# Patient Record
Sex: Male | Born: 1965 | Hispanic: Yes | Marital: Married | Smoking: Never smoker
Health system: Southern US, Community
[De-identification: ages and names within clinical notes are randomized; demographics above are authoritative.]

## PROBLEM LIST (undated history)

## (undated) DIAGNOSIS — E119 Type 2 diabetes mellitus without complications: Secondary | ICD-10-CM

## (undated) HISTORY — PX: APPENDECTOMY: SHX54

---

## 2018-11-06 ENCOUNTER — Encounter: Payer: Self-pay | Admitting: Emergency Medicine

## 2018-11-06 ENCOUNTER — Other Ambulatory Visit: Payer: Self-pay

## 2018-11-06 DIAGNOSIS — Z6831 Body mass index (BMI) 31.0-31.9, adult: Secondary | ICD-10-CM

## 2018-11-06 DIAGNOSIS — E669 Obesity, unspecified: Secondary | ICD-10-CM | POA: Diagnosis present

## 2018-11-06 DIAGNOSIS — Z888 Allergy status to other drugs, medicaments and biological substances status: Secondary | ICD-10-CM

## 2018-11-06 DIAGNOSIS — Z9119 Patient's noncompliance with other medical treatment and regimen: Secondary | ICD-10-CM

## 2018-11-06 DIAGNOSIS — K82A1 Gangrene of gallbladder in cholecystitis: Principal | ICD-10-CM | POA: Diagnosis present

## 2018-11-06 DIAGNOSIS — K8000 Calculus of gallbladder with acute cholecystitis without obstruction: Secondary | ICD-10-CM | POA: Diagnosis present

## 2018-11-06 DIAGNOSIS — Z9049 Acquired absence of other specified parts of digestive tract: Secondary | ICD-10-CM

## 2018-11-06 DIAGNOSIS — Z20828 Contact with and (suspected) exposure to other viral communicable diseases: Secondary | ICD-10-CM | POA: Diagnosis present

## 2018-11-06 DIAGNOSIS — E1165 Type 2 diabetes mellitus with hyperglycemia: Secondary | ICD-10-CM | POA: Diagnosis present

## 2018-11-06 DIAGNOSIS — K828 Other specified diseases of gallbladder: Secondary | ICD-10-CM | POA: Diagnosis present

## 2018-11-06 DIAGNOSIS — Z23 Encounter for immunization: Secondary | ICD-10-CM

## 2018-11-06 LAB — COMPREHENSIVE METABOLIC PANEL
ALT: 164 U/L — ABNORMAL HIGH (ref 0–44)
AST: 128 U/L — ABNORMAL HIGH (ref 15–41)
Albumin: 4.1 g/dL (ref 3.5–5.0)
Alkaline Phosphatase: 150 U/L — ABNORMAL HIGH (ref 38–126)
Anion gap: 15 (ref 5–15)
BUN: 10 mg/dL (ref 6–20)
CO2: 26 mmol/L (ref 22–32)
Calcium: 9.4 mg/dL (ref 8.9–10.3)
Chloride: 91 mmol/L — ABNORMAL LOW (ref 98–111)
Creatinine, Ser: 0.61 mg/dL (ref 0.61–1.24)
GFR calc Af Amer: >60 mL/min (ref >60)
GFR calc non Af Amer: >60 mL/min (ref >60)
Glucose, Bld: 344 mg/dL — ABNORMAL HIGH (ref 70–99)
Potassium: 3.6 mmol/L (ref 3.5–5.1)
Sodium: 132 mmol/L — ABNORMAL LOW (ref 135–145)
Total Bilirubin: 1.4 mg/dL — ABNORMAL HIGH (ref 0.3–1.2)
Total Protein: 7.7 g/dL (ref 6.5–8.1)

## 2018-11-06 LAB — CBC WITH DIFFERENTIAL/PLATELET
Abs Immature Granulocytes: 0.1 10*3/uL — ABNORMAL HIGH (ref 0.00–0.07)
Basophils Absolute: 0.1 10*3/uL (ref 0.0–0.1)
Basophils Relative: 0 %
Eosinophils Absolute: 0 10*3/uL (ref 0.0–0.5)
Eosinophils Relative: 0 %
HCT: 45.7 % (ref 39.0–52.0)
Hemoglobin: 16.1 g/dL (ref 13.0–17.0)
Immature Granulocytes: 1 %
Lymphocytes Relative: 3 %
Lymphs Abs: 0.7 10*3/uL (ref 0.7–4.0)
MCH: 30.4 pg (ref 26.0–34.0)
MCHC: 35.2 g/dL (ref 30.0–36.0)
MCV: 86.2 fL (ref 80.0–100.0)
Monocytes Absolute: 0.9 10*3/uL (ref 0.1–1.0)
Monocytes Relative: 4 %
Neutro Abs: 19 10*3/uL — ABNORMAL HIGH (ref 1.7–7.7)
Neutrophils Relative %: 92 %
Platelets: 258 10*3/uL (ref 150–400)
RBC: 5.3 MIL/uL (ref 4.22–5.81)
RDW: 12.1 % (ref 11.5–15.5)
WBC: 20.7 10*3/uL — ABNORMAL HIGH (ref 4.0–10.5)
nRBC: 0 % (ref 0.0–0.2)

## 2018-11-06 LAB — LIPASE, BLOOD: Lipase: 24 U/L (ref 11–51)

## 2018-11-06 LAB — TROPONIN I (HIGH SENSITIVITY): Troponin I (High Sensitivity): 5 ng/L (ref <18)

## 2018-11-06 MED ORDER — LIDOCAINE VISCOUS HCL 2 % MT SOLN
15.0000 mL | Freq: Once | OROMUCOSAL | Status: AC
  Administered 2018-11-06: 15 mL via ORAL
  Filled 2018-11-06: qty 15

## 2018-11-06 MED ORDER — FENTANYL CITRATE (PF) 100 MCG/2ML IJ SOLN
50.0000 ug | INTRAMUSCULAR | Status: DC | PRN
  Administered 2018-11-06: 25 ug via NASAL
  Filled 2018-11-06: qty 2

## 2018-11-06 MED ORDER — ALUM & MAG HYDROXIDE-SIMETH 200-200-20 MG/5ML PO SUSP
30.0000 mL | Freq: Once | ORAL | Status: AC
  Administered 2018-11-06: 30 mL via ORAL
  Filled 2018-11-06: qty 30

## 2018-11-06 MED ORDER — ONDANSETRON 4 MG PO TBDP
4.0000 mg | ORAL_TABLET | Freq: Once | ORAL | Status: AC | PRN
  Administered 2018-11-06: 4 mg via ORAL
  Filled 2018-11-06: qty 1

## 2018-11-06 NOTE — ED Triage Notes (Addendum)
Pt to triage via w/c with no distress noted, mask in place, brought in by EMS; reports upper abd pain and "burning"; radiating into back; rx omeprazole today without relief; denies hx of same; st pain began after eating a lot of hot sauce yesterday

## 2018-11-07 ENCOUNTER — Other Ambulatory Visit: Payer: Self-pay

## 2018-11-07 ENCOUNTER — Inpatient Hospital Stay: Payer: Self-pay | Admitting: Anesthesiology

## 2018-11-07 ENCOUNTER — Inpatient Hospital Stay
Admission: EM | Admit: 2018-11-07 | Discharge: 2018-11-10 | DRG: 418 | Disposition: A | Discharge status: Home / Self Care | Payer: Self-pay | Attending: Surgery | Admitting: Surgery

## 2018-11-07 ENCOUNTER — Inpatient Hospital Stay: Payer: Self-pay

## 2018-11-07 ENCOUNTER — Encounter
Admission: EM | Disposition: A | Discharge status: Home / Self Care | Payer: Self-pay | Source: Home / Self Care | Attending: Surgery

## 2018-11-07 ENCOUNTER — Emergency Department: Payer: Self-pay

## 2018-11-07 ENCOUNTER — Encounter: Payer: Self-pay | Admitting: Anesthesiology

## 2018-11-07 DIAGNOSIS — K819 Cholecystitis, unspecified: Secondary | ICD-10-CM | POA: Diagnosis present

## 2018-11-07 DIAGNOSIS — Z419 Encounter for procedure for purposes other than remedying health state, unspecified: Secondary | ICD-10-CM

## 2018-11-07 DIAGNOSIS — R109 Unspecified abdominal pain: Secondary | ICD-10-CM

## 2018-11-07 DIAGNOSIS — K810 Acute cholecystitis: Secondary | ICD-10-CM

## 2018-11-07 HISTORY — DX: Type 2 diabetes mellitus without complications: E11.9

## 2018-11-07 HISTORY — PX: CHOLECYSTECTOMY: SHX55

## 2018-11-07 LAB — COMPREHENSIVE METABOLIC PANEL
ALT: 124 U/L — ABNORMAL HIGH (ref 0–44)
AST: 71 U/L — ABNORMAL HIGH (ref 15–41)
Albumin: 3.7 g/dL (ref 3.5–5.0)
Alkaline Phosphatase: 124 U/L (ref 38–126)
Anion gap: 11 (ref 5–15)
BUN: 13 mg/dL (ref 6–20)
CO2: 26 mmol/L (ref 22–32)
Calcium: 8.7 mg/dL — ABNORMAL LOW (ref 8.9–10.3)
Chloride: 96 mmol/L — ABNORMAL LOW (ref 98–111)
Creatinine, Ser: 0.81 mg/dL (ref 0.61–1.24)
GFR calc Af Amer: >60 mL/min (ref >60)
GFR calc non Af Amer: >60 mL/min (ref >60)
Glucose, Bld: 366 mg/dL — ABNORMAL HIGH (ref 70–99)
Potassium: 4.5 mmol/L (ref 3.5–5.1)
Sodium: 133 mmol/L — ABNORMAL LOW (ref 135–145)
Total Bilirubin: 1.4 mg/dL — ABNORMAL HIGH (ref 0.3–1.2)
Total Protein: 6.9 g/dL (ref 6.5–8.1)

## 2018-11-07 LAB — CBC
HCT: 42.6 % (ref 39.0–52.0)
Hemoglobin: 15 g/dL (ref 13.0–17.0)
MCH: 30.5 pg (ref 26.0–34.0)
MCHC: 35.2 g/dL (ref 30.0–36.0)
MCV: 86.8 fL (ref 80.0–100.0)
Platelets: 209 10*3/uL (ref 150–400)
RBC: 4.91 MIL/uL (ref 4.22–5.81)
RDW: 12.6 % (ref 11.5–15.5)
WBC: 29.6 10*3/uL — ABNORMAL HIGH (ref 4.0–10.5)
nRBC: 0 % (ref 0.0–0.2)

## 2018-11-07 LAB — GLUCOSE, CAPILLARY
Glucose-Capillary: 366 mg/dL — ABNORMAL HIGH (ref 70–99)
Glucose-Capillary: 321 mg/dL — ABNORMAL HIGH (ref 70–99)
Glucose-Capillary: 328 mg/dL — ABNORMAL HIGH (ref 70–99)
Glucose-Capillary: 378 mg/dL — ABNORMAL HIGH (ref 70–99)
Glucose-Capillary: 315 mg/dL — ABNORMAL HIGH (ref 70–99)
Glucose-Capillary: 326 mg/dL — ABNORMAL HIGH (ref 70–99)

## 2018-11-07 LAB — APTT: aPTT: 28 seconds (ref 24–36)

## 2018-11-07 LAB — SARS CORONAVIRUS 2 BY RT PCR (HOSPITAL ORDER, PERFORMED IN ~~LOC~~ HOSPITAL LAB): SARS Coronavirus 2: NEGATIVE

## 2018-11-07 LAB — PROTIME-INR
INR: 1.1 (ref 0.8–1.2)
Prothrombin Time: 14.5 seconds (ref 11.4–15.2)

## 2018-11-07 LAB — BILIRUBIN, DIRECT: Bilirubin, Direct: 0.4 mg/dL — ABNORMAL HIGH (ref 0.0–0.2)

## 2018-11-07 SURGERY — LAPAROSCOPIC CHOLECYSTECTOMY WITH INTRAOPERATIVE CHOLANGIOGRAM
Anesthesia: General | Site: Abdomen

## 2018-11-07 MED ORDER — PROCHLORPERAZINE MALEATE 10 MG PO TABS
10.0000 mg | ORAL_TABLET | Freq: Four times a day (QID) | ORAL | Status: DC | PRN
  Filled 2018-11-07: qty 1

## 2018-11-07 MED ORDER — SODIUM CHLORIDE (PF) 0.9 % IJ SOLN
INTRAMUSCULAR | Status: AC
  Filled 2018-11-07: qty 50

## 2018-11-07 MED ORDER — BUPIVACAINE-EPINEPHRINE (PF) 0.25% -1:200000 IJ SOLN
INTRAMUSCULAR | Status: AC
  Filled 2018-11-07: qty 30

## 2018-11-07 MED ORDER — PROPOFOL 10 MG/ML IV BOLUS
INTRAVENOUS | Status: AC
  Filled 2018-11-07: qty 20

## 2018-11-07 MED ORDER — PROCHLORPERAZINE EDISYLATE 10 MG/2ML IJ SOLN
5.0000 mg | Freq: Four times a day (QID) | INTRAMUSCULAR | Status: DC | PRN
  Filled 2018-11-07: qty 2

## 2018-11-07 MED ORDER — SODIUM CHLORIDE 0.9 % IV BOLUS
1000.0000 mL | Freq: Once | INTRAVENOUS | Status: AC
  Administered 2018-11-07: 04:00:00 1000 mL via INTRAVENOUS

## 2018-11-07 MED ORDER — INSULIN ASPART 100 UNIT/ML ~~LOC~~ SOLN
5.0000 [IU] | Freq: Once | SUBCUTANEOUS | Status: AC
  Administered 2018-11-07: 5 [IU] via SUBCUTANEOUS

## 2018-11-07 MED ORDER — MEPERIDINE HCL 50 MG/ML IJ SOLN: 6.2500 mg | INTRAMUSCULAR | Status: DC | PRN

## 2018-11-07 MED ORDER — OXYCODONE HCL 5 MG PO TABS: 5.0000 mg | ORAL_TABLET | Freq: Once | ORAL | Status: DC | PRN

## 2018-11-07 MED ORDER — HEPARIN SODIUM (PORCINE) 5000 UNIT/ML IJ SOLN
5000.0000 [IU] | Freq: Three times a day (TID) | INTRAMUSCULAR | Status: DC
  Administered 2018-11-07 – 2018-11-10 (×9): 5000 [IU] via SUBCUTANEOUS
  Filled 2018-11-07 (×8): qty 1

## 2018-11-07 MED ORDER — KETOROLAC TROMETHAMINE 30 MG/ML IJ SOLN: 30.0000 mg | Freq: Four times a day (QID) | INTRAMUSCULAR | Status: DC | PRN

## 2018-11-07 MED ORDER — FENTANYL CITRATE (PF) 100 MCG/2ML IJ SOLN
INTRAMUSCULAR | Status: DC | PRN
  Administered 2018-11-07 (×2): 50 ug via INTRAVENOUS

## 2018-11-07 MED ORDER — ONDANSETRON HCL 4 MG/2ML IJ SOLN
4.0000 mg | Freq: Four times a day (QID) | INTRAMUSCULAR | Status: DC | PRN
  Administered 2018-11-07: 4 mg via INTRAVENOUS

## 2018-11-07 MED ORDER — ACETAMINOPHEN 500 MG PO TABS
1000.0000 mg | ORAL_TABLET | Freq: Four times a day (QID) | ORAL | Status: DC
  Administered 2018-11-07 – 2018-11-10 (×12): 1000 mg via ORAL
  Filled 2018-11-07 (×12): qty 2

## 2018-11-07 MED ORDER — PHENYLEPHRINE HCL (PRESSORS) 10 MG/ML IV SOLN
INTRAVENOUS | Status: DC | PRN
  Administered 2018-11-07: 200 ug via INTRAVENOUS
  Administered 2018-11-07 (×3): 100 ug via INTRAVENOUS

## 2018-11-07 MED ORDER — PANTOPRAZOLE SODIUM 40 MG IV SOLR
40.0000 mg | Freq: Every day | INTRAVENOUS | Status: DC
  Administered 2018-11-07 – 2018-11-09 (×3): 40 mg via INTRAVENOUS
  Filled 2018-11-07 (×3): qty 40

## 2018-11-07 MED ORDER — INSULIN ASPART 100 UNIT/ML ~~LOC~~ SOLN
6.0000 [IU] | Freq: Three times a day (TID) | SUBCUTANEOUS | Status: DC
  Administered 2018-11-07 – 2018-11-10 (×7): 6 [IU] via SUBCUTANEOUS
  Filled 2018-11-07 (×7): qty 1

## 2018-11-07 MED ORDER — MORPHINE SULFATE (PF) 4 MG/ML IV SOLN
4.0000 mg | Freq: Once | INTRAVENOUS | Status: AC
  Administered 2018-11-07: 4 mg via INTRAVENOUS
  Filled 2018-11-07: qty 1

## 2018-11-07 MED ORDER — ONDANSETRON 4 MG PO TBDP: 4.0000 mg | ORAL_TABLET | Freq: Four times a day (QID) | ORAL | Status: DC | PRN

## 2018-11-07 MED ORDER — MORPHINE SULFATE (PF) 4 MG/ML IV SOLN
4.0000 mg | Freq: Once | INTRAVENOUS | Status: AC
  Administered 2018-11-07: 02:00:00 4 mg via INTRAVENOUS
  Filled 2018-11-07: qty 1

## 2018-11-07 MED ORDER — ONDANSETRON HCL 4 MG/2ML IJ SOLN
INTRAMUSCULAR | Status: AC
  Filled 2018-11-07: qty 2

## 2018-11-07 MED ORDER — SODIUM CHLORIDE 0.9 % IV SOLN
INTRAVENOUS | Status: DC
  Administered 2018-11-07 – 2018-11-09 (×8): via INTRAVENOUS

## 2018-11-07 MED ORDER — KETOROLAC TROMETHAMINE 30 MG/ML IJ SOLN
30.0000 mg | Freq: Four times a day (QID) | INTRAMUSCULAR | Status: DC
  Administered 2018-11-07 – 2018-11-10 (×11): 30 mg via INTRAVENOUS
  Filled 2018-11-07 (×13): qty 1

## 2018-11-07 MED ORDER — PIPERACILLIN-TAZOBACTAM 3.375 G IVPB 30 MIN
3.3750 g | Freq: Once | INTRAVENOUS | Status: AC
  Administered 2018-11-07: 04:00:00 3.375 g via INTRAVENOUS
  Filled 2018-11-07: qty 50

## 2018-11-07 MED ORDER — GLUCAGON HCL RDNA (DIAGNOSTIC) 1 MG IJ SOLR
INTRAMUSCULAR | Status: AC
  Filled 2018-11-07: qty 1

## 2018-11-07 MED ORDER — PNEUMOCOCCAL VAC POLYVALENT 25 MCG/0.5ML IJ INJ
0.5000 mL | INJECTION | INTRAMUSCULAR | Status: AC
  Administered 2018-11-08: 09:00:00 0.5 mL via INTRAMUSCULAR
  Filled 2018-11-07: qty 0.5

## 2018-11-07 MED ORDER — SUGAMMADEX SODIUM 500 MG/5ML IV SOLN
INTRAVENOUS | Status: DC | PRN
  Administered 2018-11-07: 300 mg via INTRAVENOUS

## 2018-11-07 MED ORDER — DEXAMETHASONE SODIUM PHOSPHATE 10 MG/ML IJ SOLN
INTRAMUSCULAR | Status: DC | PRN
  Administered 2018-11-07: 5 mg via INTRAVENOUS

## 2018-11-07 MED ORDER — INSULIN ASPART 100 UNIT/ML ~~LOC~~ SOLN
0.0000 [IU] | Freq: Three times a day (TID) | SUBCUTANEOUS | Status: DC
  Administered 2018-11-07: 18:00:00 15 [IU] via SUBCUTANEOUS
  Administered 2018-11-07: 09:00:00 20 [IU] via SUBCUTANEOUS
  Administered 2018-11-08 (×2): 7 [IU] via SUBCUTANEOUS
  Administered 2018-11-08: 4 [IU] via SUBCUTANEOUS
  Administered 2018-11-09: 6 [IU] via SUBCUTANEOUS
  Administered 2018-11-09 – 2018-11-10 (×2): 4 [IU] via SUBCUTANEOUS
  Administered 2018-11-10: 3 [IU] via SUBCUTANEOUS
  Filled 2018-11-07 (×9): qty 1

## 2018-11-07 MED ORDER — BUPIVACAINE-EPINEPHRINE 0.25% -1:200000 IJ SOLN
INTRAMUSCULAR | Status: DC | PRN
  Administered 2018-11-07: 30 mL

## 2018-11-07 MED ORDER — FENTANYL CITRATE (PF) 250 MCG/5ML IJ SOLN
INTRAMUSCULAR | Status: AC
  Filled 2018-11-07: qty 5

## 2018-11-07 MED ORDER — HYDRALAZINE HCL 20 MG/ML IJ SOLN: 10.0000 mg | INTRAMUSCULAR | Status: DC | PRN

## 2018-11-07 MED ORDER — MIDAZOLAM HCL 2 MG/2ML IJ SOLN
INTRAMUSCULAR | Status: DC | PRN
  Administered 2018-11-07: 2 mg via INTRAVENOUS

## 2018-11-07 MED ORDER — SODIUM CHLORIDE 0.9 % IV BOLUS: 1000.0000 mL | Freq: Once | INTRAVENOUS | Status: DC

## 2018-11-07 MED ORDER — INSULIN ASPART 100 UNIT/ML ~~LOC~~ SOLN
SUBCUTANEOUS | Status: AC
  Administered 2018-11-07: 14:00:00 5 [IU]
  Filled 2018-11-07: qty 1

## 2018-11-07 MED ORDER — MORPHINE SULFATE (PF) 2 MG/ML IV SOLN: 2.0000 mg | INTRAVENOUS | Status: DC | PRN

## 2018-11-07 MED ORDER — DIPHENHYDRAMINE HCL 25 MG PO CAPS: 25.0000 mg | ORAL_CAPSULE | Freq: Four times a day (QID) | ORAL | Status: DC | PRN

## 2018-11-07 MED ORDER — LIDOCAINE HCL (CARDIAC) PF 100 MG/5ML IV SOSY
PREFILLED_SYRINGE | INTRAVENOUS | Status: DC | PRN
  Administered 2018-11-07: 100 mg via INTRAVENOUS

## 2018-11-07 MED ORDER — LACTATED RINGERS IV SOLN
INTRAVENOUS | Status: DC | PRN
  Administered 2018-11-07: 11:00:00 via INTRAVENOUS

## 2018-11-07 MED ORDER — IOHEXOL 300 MG/ML  SOLN
INTRAMUSCULAR | Status: DC | PRN
  Administered 2018-11-07: 15 mL

## 2018-11-07 MED ORDER — GLUCAGON HCL RDNA (DIAGNOSTIC) 1 MG IJ SOLR
INTRAMUSCULAR | Status: DC | PRN
  Administered 2018-11-07: 1 mL via INTRAVENOUS

## 2018-11-07 MED ORDER — ONDANSETRON HCL 4 MG/2ML IJ SOLN: 4.0000 mg | INTRAMUSCULAR | Status: DC

## 2018-11-07 MED ORDER — DIPHENHYDRAMINE HCL 50 MG/ML IJ SOLN: 25.0000 mg | Freq: Four times a day (QID) | INTRAMUSCULAR | Status: DC | PRN

## 2018-11-07 MED ORDER — INSULIN DETEMIR 100 UNIT/ML ~~LOC~~ SOLN
5.0000 [IU] | Freq: Every day | SUBCUTANEOUS | Status: DC
  Administered 2018-11-07: 5 [IU] via SUBCUTANEOUS
  Filled 2018-11-07 (×2): qty 0.05

## 2018-11-07 MED ORDER — INSULIN ASPART 100 UNIT/ML ~~LOC~~ SOLN
0.0000 [IU] | Freq: Every day | SUBCUTANEOUS | Status: DC
  Administered 2018-11-07: 22:00:00 4 [IU] via SUBCUTANEOUS
  Administered 2018-11-08: 2 [IU] via SUBCUTANEOUS
  Administered 2018-11-09: 22:00:00 3 [IU] via SUBCUTANEOUS
  Filled 2018-11-07 (×3): qty 1

## 2018-11-07 MED ORDER — MIDAZOLAM HCL 2 MG/2ML IJ SOLN
INTRAMUSCULAR | Status: AC
  Filled 2018-11-07: qty 2

## 2018-11-07 MED ORDER — OXYCODONE HCL 5 MG PO TABS: 5.0000 mg | ORAL_TABLET | ORAL | Status: DC | PRN

## 2018-11-07 MED ORDER — OXYCODONE HCL 5 MG/5ML PO SOLN: 5.0000 mg | Freq: Once | ORAL | Status: DC | PRN

## 2018-11-07 MED ORDER — PIPERACILLIN-TAZOBACTAM 3.375 G IVPB
3.3750 g | Freq: Three times a day (TID) | INTRAVENOUS | Status: DC
  Administered 2018-11-07 – 2018-11-09 (×7): 3.375 g via INTRAVENOUS
  Filled 2018-11-07 (×8): qty 50

## 2018-11-07 MED ORDER — PROPOFOL 10 MG/ML IV BOLUS
INTRAVENOUS | Status: DC | PRN
  Administered 2018-11-07: 160 mg via INTRAVENOUS

## 2018-11-07 MED ORDER — ROCURONIUM BROMIDE 100 MG/10ML IV SOLN
INTRAVENOUS | Status: DC | PRN
  Administered 2018-11-07: 50 mg via INTRAVENOUS
  Administered 2018-11-07: 20 mg via INTRAVENOUS

## 2018-11-07 MED ORDER — FENTANYL CITRATE (PF) 100 MCG/2ML IJ SOLN: 25.0000 ug | INTRAMUSCULAR | Status: DC | PRN

## 2018-11-07 MED ORDER — PROMETHAZINE HCL 25 MG/ML IJ SOLN: 6.2500 mg | INTRAMUSCULAR | Status: DC | PRN

## 2018-11-07 SURGICAL SUPPLY — 49 items
APPLICATOR COTTON TIP 6 STRL (MISCELLANEOUS) ×1 IMPLANT
APPLICATOR COTTON TIP 6IN STRL (MISCELLANEOUS) ×3
APPLIER CLIP 5 13 M/L LIGAMAX5 (MISCELLANEOUS) ×3
BLADE CLIPPER SURG (BLADE) ×3 IMPLANT
BULB RESERV EVAC DRAIN JP 100C (MISCELLANEOUS) ×3 IMPLANT
CANISTER SUCT 1200ML W/VALVE (MISCELLANEOUS) ×3 IMPLANT
CHLORAPREP W/TINT 26 (MISCELLANEOUS) ×6 IMPLANT
CHOLANGIOGRAM CATH TAUT (CATHETERS) ×3 IMPLANT
CLIP APPLIE 5 13 M/L LIGAMAX5 (MISCELLANEOUS) ×1 IMPLANT
COVER WAND RF STERILE (DRAPES) ×3 IMPLANT
DECANTER SPIKE VIAL GLASS SM (MISCELLANEOUS) ×6 IMPLANT
DERMABOND ADVANCED (GAUZE/BANDAGES/DRESSINGS) ×2
DERMABOND ADVANCED .7 DNX12 (GAUZE/BANDAGES/DRESSINGS) ×1 IMPLANT
DRAIN CHANNEL JP 19F (MISCELLANEOUS) ×3 IMPLANT
DRAPE C-ARM XRAY 36X54 (DRAPES) ×6 IMPLANT
ELECT CAUTERY BLADE 6.4 (BLADE) ×3 IMPLANT
ELECT REM PT RETURN 9FT ADLT (ELECTROSURGICAL) ×3
ELECTRODE REM PT RTRN 9FT ADLT (ELECTROSURGICAL) ×1 IMPLANT
ENDOLOOP SUT PDS II  0 18 (SUTURE) ×2
ENDOLOOP SUT PDS II 0 18 (SUTURE) ×1 IMPLANT
GLOVE BIO SURGEON STRL SZ7 (GLOVE) ×21 IMPLANT
GOWN STRL REUS W/ TWL LRG LVL3 (GOWN DISPOSABLE) ×4 IMPLANT
GOWN STRL REUS W/TWL LRG LVL3 (GOWN DISPOSABLE) ×8
IRRIGATION STRYKERFLOW (MISCELLANEOUS) ×1 IMPLANT
IRRIGATOR STRYKERFLOW (MISCELLANEOUS) ×3
IV CATH ANGIO 12GX3 LT BLUE (NEEDLE) ×3 IMPLANT
IV NS 1000ML (IV SOLUTION) ×2
IV NS 1000ML BAXH (IV SOLUTION) ×1 IMPLANT
L-HOOK LAP DISP 36CM (ELECTROSURGICAL) ×3
LHOOK LAP DISP 36CM (ELECTROSURGICAL) ×1 IMPLANT
NEEDLE HYPO 22GX1.5 SAFETY (NEEDLE) ×3 IMPLANT
NS IRRIG 500ML POUR BTL (IV SOLUTION) ×3 IMPLANT
PACK LAP CHOLECYSTECTOMY (MISCELLANEOUS) ×3 IMPLANT
PENCIL ELECTRO HAND CTR (MISCELLANEOUS) ×3 IMPLANT
POUCH SPECIMEN RETRIEVAL 10MM (ENDOMECHANICALS) ×3 IMPLANT
SCISSORS METZENBAUM CVD 33 (INSTRUMENTS) ×3 IMPLANT
SET TUBE SMOKE EVAC HIGH FLOW (TUBING) ×3 IMPLANT
SLEEVE ENDOPATH XCEL 5M (ENDOMECHANICALS) ×9 IMPLANT
SPONGE DRAIN TRACH 4X4 STRL 2S (GAUZE/BANDAGES/DRESSINGS) ×3 IMPLANT
SPONGE LAP 18X18 RF (DISPOSABLE) ×3 IMPLANT
STOPCOCK 4 WAY LG BORE MALE ST (IV SETS) ×3 IMPLANT
SUT ETHIBOND 0 MO6 C/R (SUTURE) IMPLANT
SUT ETHILON 3-0 FS-10 30 BLK (SUTURE) ×3
SUT MNCRL AB 4-0 PS2 18 (SUTURE) ×3 IMPLANT
SUT VICRYL 0 AB UR-6 (SUTURE) ×6 IMPLANT
SUTURE EHLN 3-0 FS-10 30 BLK (SUTURE) ×1 IMPLANT
SYR 20ML LL LF (SYRINGE) ×3 IMPLANT
TROCAR XCEL BLUNT TIP 100MML (ENDOMECHANICALS) ×3 IMPLANT
TROCAR XCEL NON-BLD 5MMX100MML (ENDOMECHANICALS) ×3 IMPLANT

## 2018-11-07 NOTE — ED Provider Notes (Signed)
[facility] Emergency Department Provider Note  ____________________________________________   First MD Initiated Contact with Patient 11/07/18 [phone]     (approximate)  I have reviewed the triage vital signs and the nursing notes.   HISTORY  Chief Complaint Abdominal Pain  The patient and/or family speak(s) Spanish.  They understand they have the right to the use of a hospital interpreter, however at this time they prefer to speak directly with me in Spanish.  They know that they can ask for an interpreter at any time.   HPI Derek George is a 53 y.o. male reports diabetes but no other chronic medical issues and presents for evaluation of acute onset and severe burning upper abdominal and right upper quadrant pain that started yesterday.  He said it started after a meal in which he ate a lot of hot sauce.  Nothing in particular has made it better or worse including medications for acid reflux.  He has never had these sorts of symptoms before.  He reports the pain is severe and radiates from the upper abdomen to his back.  It has been constant for about 24 hours.  He has had a little bit of nausea but no vomiting.  No diarrhea nor constipation.  He denies contact with COVID-19 patients, sore throat, chest pain, shortness of breath, cough, lower abdominal pain, and dysuria.         Past Medical History:  Diagnosis Date  . Diabetes mellitus without complication [facility](HCC)     Patient Active Problem List   Diagnosis Date Noted  . Cholecystitis 11/07/2018    Past Surgical History:  Procedure Laterality Date  . APPENDECTOMY      Prior to Admission medications   Not on File    Allergies Patient has no known allergies.  No family history on file.  Social History Social History   Tobacco Use  . Smoking status: Never Smoker  . Smokeless tobacco: Never Used  Substance Use Topics  . Alcohol use: Never    Frequency: Never  . Drug use: Not on file    Review of Systems Constitutional: No fever/chills Eyes: No visual changes. ENT: No sore throat. Cardiovascular: Denies chest pain. Respiratory: Denies shortness of breath. Gastrointestinal: Upper central and right-sided abdominal pain with some nausea but no vomiting  genitourinary: Negative for dysuria. Musculoskeletal: Negative for neck pain.  Negative for back pain. Integumentary: Negative for rash. Neurological: Negative for headaches, focal weakness or numbness.   ____________________________________________   PHYSICAL EXAM:  VITAL SIGNS: ED Triage Vitals [11/06/18 2231]  Enc Vitals Group     BP (!) 141/69     Pulse Rate 92     Resp 18     Temp 98.5 F (36.9 C)     Temp Source Oral     SpO2 97 %     Weight      Height      Head Circumference      Peak Flow      Pain Score 6     Pain Loc      Pain Edu?      Excl. in GC?     Constitutional: Alert and oriented.  Appears acutely uncomfortable and in pain. Eyes: Conjunctivae are normal.  Head: Atraumatic. Nose: No congestion/rhinnorhea. Mouth/Throat: Mucous membranes are moist. Neck: No stridor.  No meningeal signs.   Cardiovascular: Normal rate, regular rhythm. Good peripheral circulation. Grossly normal heart sounds. Respiratory: Normal respiratory effort.  No retractions. Gastrointestinal: Tenderness to  palpation of the epigastrium and RUQ with equivocal Murphy sign Musculoskeletal: No lower extremity tenderness nor edema. No gross deformities of extremities. Neurologic:  Normal speech and language. No gross focal neurologic deficits are appreciated.  Skin:  Skin is warm, dry and intact. Psychiatric: Mood and affect are normal. Speech and behavior are normal.  ____________________________________________   LABS (all labs ordered are listed, but only abnormal results are displayed)  Labs Reviewed  CBC WITH DIFFERENTIAL/PLATELET - Abnormal; Notable for the following components:      Result Value   WBC  20.7 (*)    Neutro Abs 19.0 (*)    Abs Immature Granulocytes 0.10 (*)    All other components within normal limits  COMPREHENSIVE METABOLIC PANEL - Abnormal; Notable for the following components:   Sodium 132 (*)    Chloride 91 (*)    Glucose, Bld 344 (*)    AST 128 (*)    ALT 164 (*)    Alkaline Phosphatase 150 (*)    Total Bilirubin 1.4 (*)    All other components within normal limits  SARS CORONAVIRUS 2 (HOSPITAL ORDER, PERFORMED IN  HOSPITAL LAB)  LIPASE, BLOOD  PROTIME-INR  APTT  TROPONIN I (HIGH SENSITIVITY)   ____________________________________________  EKG  ED ECG REPORT I, Loleta Rose, the attending physician, personally viewed and interpreted this ECG.  Date: 11/06/2018 EKG Time: 22: 34 Rate: 93 Rhythm: normal sinus rhythm QRS Axis: normal Intervals: normal ST/T Wave abnormalities: Non-specific ST segment / T-wave changes, but no clear evidence of acute ischemia. Narrative Interpretation: no definitive evidence of acute ischemia; does not meet STEMI criteria.   ____________________________________________  RADIOLOGY I, Loleta Rose, personally viewed and evaluated these images (plain radiographs) as part of my medical decision making, as well as reviewing the written report by the radiologist.  ED MD interpretation: Acute cholecystitis identified on ultrasound  Official radiology report(s): US Abdomen Limited Ruq  Result Date: 11/07/2018 CLINICAL DATA:  Upper abdominal pain radiating to the back since yesterday. EXAM: ULTRASOUND ABDOMEN LIMITED RIGHT UPPER QUADRANT COMPARISON:  None. FINDINGS: Gallbladder: Distended containing hyperechoic intraluminal contents, likely combination of sludge and stones. Intraluminal contents lead to posterior shadowing partially obscuring evaluation of the posterior wall. There is gallbladder wall thickening at 6 mm. Small amount of pericholecystic fluid. Positive sonographic Murphy sign noted by sonographer. Common  bile duct: Diameter: 3 mm. Liver: No focal lesion identified. Heterogeneously increased in parenchymal echogenicity. Liver parenchyma is difficult to penetrate. Portal vein is patent on color Doppler imaging with normal direction of blood flow towards the liver. Other: None. IMPRESSION: 1. Findings consistent with acute cholecystitis. Distended gallbladder filled with sludge and stones with wall thickening, pericholecystic fluid, and positive sonographic Murphy sign. 2. No biliary dilatation. 3. Hepatic steatosis. Electronically Signed   By: Narda Rutherford M.D.   On: 11/07/2018 02:29    ____________________________________________   PROCEDURES   Procedure(s) performed (including Critical Care):  Procedures   ____________________________________________   INITIAL IMPRESSION / MDM / ASSESSMENT AND PLAN / ED COURSE  As part of my medical decision making, I reviewed the following data within the electronic MEDICAL RECORD NUMBER Nursing notes reviewed and incorporated, Labs reviewed , Old chart reviewed, Discussed with admitting physician (Dr. Everlene Farrier), A consult was requested and obtained from this/these consultant(s) Surgery and Notes from prior ED visits   Differential diagnosis includes, but is not limited to, biliary colic with the possibility of acute cholecystitis, acid reflux, gastric/peptic ulcer, viral gastritis, drug or alcohol side effect.  The patient denies drinking alcohol or taking any drugs.  He has never had these symptoms in the past but his presentation is most consistent with biliary colic.  His lab work is notable for elevated LFTs And a leukocytosis of 20.7.  He is afebrile so acute cholangitis is unlikely but I think cholecystitis is a strong possibility.  I am ordering an IV and morphine 4 mg IV and he already received Zofran as well as a GI cocktail while he was in the lobby prior to my evaluation of the patient.   Ultrasound is pending, no indication for empiric antibiotics.   No concern for ACS at this point.  I have ordered a rapid COVID swab given the strong probability that the patient will need GI or surgical intervention either for ERCP or cholecystectomy respectively.      Clinical Course as of Nov 06 617  Wed Nov 07, 2018  0217 SARS Coronavirus 2: NEGATIVE [CF]  0247 Ultrasound consistent with cholecystitis.  I have updated the patient and explained the need for surgery and I am paging Dr. Dahlia Byes with the general surgical service.  US Abdomen Limited RUQ [CF]  0932 Patient continues to be in pain I am ordering another morphine 4 mg IV.  I will start a liter of fluids and obtain a preoperative EKG.   [CF]  0303 Left a voicemail for Dr. Dahlia Byes   [CF]  [phone] Will order Zosyn 3.375 g IV while awaiting surgical consultation.   [CF]  [id number] Dr. Dahlia Byes called back.  We discussed the case by phone.  He will put in orders and agreed with my plan for antibiotics and fluids.   [CF]    Clinical Course User Index [CF] Hinda Kehr, MD     ____________________________________________  FINAL CLINICAL IMPRESSION(S) / ED DIAGNOSES  Final diagnoses:  Abdominal pain  Acute cholecystitis     MEDICATIONS GIVEN DURING THIS VISIT:  Medications  sodium chloride 0.9 % bolus 1,000 mL (has no administration in time range)  alum & mag hydroxide-simeth (MAALOX/MYLANTA) 200-200-20 MG/5ML suspension 30 mL (30 mLs Oral Given 11/06/18 2252)    And  lidocaine (XYLOCAINE) 2 % viscous mouth solution 15 mL (15 mLs Oral Given 11/06/18 2253)  ondansetron (ZOFRAN-ODT) disintegrating tablet 4 mg (4 mg Oral Given 11/06/18 2349)  morphine 4 MG/ML injection 4 mg (4 mg Intravenous Given 11/07/18 0202)  morphine 4 MG/ML injection 4 mg (4 mg Intravenous Given 11/07/18 0349)  piperacillin-tazobactam (ZOSYN) IVPB 3.375 g (0 g Intravenous Stopped 11/07/18 0424)  sodium chloride 0.9 % bolus 1,000 mL (0 mLs Intravenous Stopped 11/07/18 0500)     ED Discharge Orders    None      *Please  note:  Enrrique Mierzwa was evaluated in Emergency Department on 11/07/2018 for the symptoms described in the history of present illness. He was evaluated in the context of the global COVID-19 pandemic, which necessitated consideration that the patient might be at risk for infection with the SARS-CoV-2 virus that causes COVID-19. Institutional protocols and algorithms that pertain to the evaluation of patients at risk for COVID-19 are in a state of rapid change based on information released by regulatory bodies including the CDC and federal and state organizations. These policies and algorithms were followed during the patient's care in the ED.  Some ED evaluations and interventions may be delayed as a result of limited staffing during the pandemic.*  Note:  This document was prepared using Dragon voice recognition software and may include unintentional  dictation errors.   Loleta Rose, MD 11/07/18 [phone]

## 2018-11-07 NOTE — ED Notes (Signed)
Ultrasound complete 

## 2018-11-07 NOTE — Op Note (Signed)
Laparoscopic Cholecystectomy  Pre-operative Diagnosis: Acute cholecystitis  Post-operative Diagnosis: same  Procedure: lap. Cholecystectomy w IOC  Surgeon: Caroleen Hamman, MD FACS  Anesthesia: Gen. with endotracheal tube  Findings: Gangrenous Cholecystitis w empyema of the GB Retained stone distal Cystic duct/ CBD junction IOC with no definitive filling defects however contrast did not reach the duodenum.    Estimated Blood Loss: 50cc         Drains: 19 FR. Blake         Specimens: Gallbladder           Complications: none   Procedure Details  The patient was seen again in the Holding Room. The benefits, complications, treatment options, and expected outcomes were discussed with the patient. The risks of bleeding, infection, recurrence of symptoms, failure to resolve symptoms, bile duct damage, bile duct leak, retained common bile duct stone, bowel injury, any of which could require further surgery and/or ERCP, stent, or papillotomy were reviewed with the patient. The likelihood of improving the patient's symptoms with return to their baseline status is good.  The patient and/or family concurred with the proposed plan, giving informed consent.  The patient was taken to Operating Room, identified as Derek George and the procedure verified as Laparoscopic Cholecystectomy.  A Time Out was held and the above information confirmed.  Prior to the induction of general anesthesia, antibiotic prophylaxis was administered. VTE prophylaxis was in place. General endotracheal anesthesia was then administered and tolerated well. After the induction, the abdomen was prepped with Chloraprep and draped in the sterile fashion. The patient was positioned in the supine position.  Cut down technique was used to enter the abdominal cavity and a Hasson trochar was placed after two vicryl stitches were anchored to the fascia. Pneumoperitoneum was then created with CO2 and tolerated well without any adverse  changes in the patient's vital signs.  Three 5-mm ports were placed in the right upper quadrant all under direct vision. All skin incisions  were infiltrated with a local anesthetic agent before making the incision and placing the trocars.   The patient was positioned  in reverse Trendelenburg, tilted slightly to the patient's left.  The gallbladder was identified, it was found necrotic. I used the suction irrigator to decompressed it and some pus was drained. The fundus grasped and retracted cephalad. Adhesions were lysed bluntly. The infundibulum was grasped and retracted laterally, exposing the peritoneum overlying the triangle of Calot. This was then divided and exposed in a blunt fashion. An extended critical view of the cystic duct and cystic artery was obtained.  The cystic duct was clearly identified and bluntly dissected.  THere was a stone at the cystic duct CBD junction, I tired gently milking it back but met resistance. I was able to incise the duct and place a catheter . Contrast was injected and c arm was used to obtain fluoro pictures. The biliary tree opacified, no CBD injuries or leak however the contrast did not opacified the duodenum. No Filling defects were seen. Glucagon administered but still no contrast was visualized within the duodenum. Catheter removed and  Artery was double clipped and divided. I used a endoloop to ligate the cystic duct stump. The gallbladder was taken from the gallbladder fossa in a retrograde fashion with the electrocautery. The gallbladder was removed and placed in an Endocatch bag. The liver bed was irrigated and inspected. Hemostasis was achieved with the electrocautery. Copious irrigation was utilized and was repeatedly aspirated until clear.  The gallbladder and Endocatch  sac were then removed through a port site.   Inspection of the right upper quadrant was performed. No bleeding, bile duct injury or leak, or bowel injury was noted. Pneumoperitoneum was  released.  The periumbilical port site was closed with interrumpted 0 Vicryl sutures. 4-0 subcuticular Monocryl was used to close the skin. Dermabond was  applied.  The patient was then extubated and brought to the recovery room in stable condition. Sponge, lap, and needle counts were correct at closure and at the conclusion of the case.               Caroleen Hamman, MD, FACS

## 2018-11-07 NOTE — Anesthesia Preprocedure Evaluation (Signed)
Anesthesia Evaluation  Patient identified by MRN, date of birth, ID band Patient awake    Reviewed: Allergy & Precautions, NPO status , Patient's Chart, lab work & pertinent test results  History of Anesthesia Complications Negative for: history of anesthetic complications  Airway Mallampati: II  TM Distance: >3 FB Neck ROM: Full    Dental  (+) Poor Dentition   Pulmonary neg pulmonary ROS, neg sleep apnea, neg COPD,    breath sounds clear to auscultation- rhonchi (-) wheezing      Cardiovascular Exercise Tolerance: Good (-) hypertension(-) CAD, (-) Past MI, (-) Cardiac Stents and (-) CABG  Rhythm:Regular Rate:Normal - Systolic murmurs and - Diastolic murmurs    Neuro/Psych neg Seizures negative neurological ROS  negative psych ROS   GI/Hepatic negative GI ROS, Neg liver ROS,   Endo/Other  diabetes (diet controlled)  Renal/GU negative Renal ROS     Musculoskeletal negative musculoskeletal ROS (+)   Abdominal (+) + obese,   Peds  Hematology negative hematology ROS (+)   Anesthesia Other Findings Past Medical History: No date: Diabetes mellitus without complication (HCC)   Reproductive/Obstetrics                             Anesthesia Physical Anesthesia Plan  ASA: II  Anesthesia Plan: General   Post-op Pain Management:    Induction: Intravenous  PONV Risk Score and Plan: 1 and Ondansetron and Midazolam  Airway Management Planned: Oral ETT  Additional Equipment:   Intra-op Plan:   Post-operative Plan: Extubation in OR  Informed Consent: I have reviewed the patients History and Physical, chart, labs and discussed the procedure including the risks, benefits and alternatives for the proposed anesthesia with the patient or authorized representative who has indicated his/her understanding and acceptance.     Dental advisory given  Plan Discussed with: CRNA and  Anesthesiologist  Anesthesia Plan Comments:         Anesthesia Quick Evaluation

## 2018-11-07 NOTE — H&P (Signed)
[facility] SURGICAL HISTORY & PHYSICAL (cpt [phone])  HISTORY OF PRESENT ILLNESS (HPI):  53 y.o. male presented to [facility] ED overnight for abdominal pain. Patient reports the onset of upper abdominal pain since yesterday. This pain is severe in nature and described as a burning pain. This radiates through to his back. Pain onset after meal yesterday which was spicy. He tried his PPI for this but did not notice any significant relief. He endorses associated nausea. No complaints of fever, chills, cough, congestion, CP, SOB, emesis, or bowel changes. No history of similar pain. No previous abdominal surgeries. Work up in the ED was concerning for leukocytosis to 20k, elevated LFTs and mildly elevated bilirubin, and cholecystitis on [place] without CBD dilation.   General surgery is consulted by emergency medicine physician Dr Loleta Rose, MD for evaluation and management of cholecystitis.    PAST MEDICAL HISTORY (PMH):  Past Medical History:  Diagnosis Date  . Diabetes mellitus without complication (HCC)     Reviewed. Otherwise negative.   PAST SURGICAL HISTORY (PSH):  Past Surgical History:  Procedure Laterality Date  . APPENDECTOMY      Reviewed. Otherwise negative.   MEDICATIONS:  Prior to Admission medications   Not on File     ALLERGIES:  Allergies  Allergen Reactions  . Metformin Shortness Of Breath    Reports liver damage Reports liver damage Reports liver damage      SOCIAL HISTORY:  Social History   Socioeconomic History  . Marital status: Married    Spouse name: Not on file  . Number of children: Not on file  . Years of education: Not on file  . Highest education level: Not on file  Occupational History  . Not on file  Social Needs  . Financial resource strain: Not on file  . Food insecurity    Worry: Not on file    Inability: Not on file  . Transportation needs    Medical: Not on file    Non-medical: Not on file  Tobacco Use  . Smoking  status: Never Smoker  . Smokeless tobacco: Never Used  Substance and Sexual Activity  . Alcohol use: Never    Frequency: Never  . Drug use: Not on file  . Sexual activity: Not on file  Lifestyle  . Physical activity    Days per week: Not on file    Minutes per session: Not on file  . Stress: Not on file  Relationships  . Social Musician on phone: Not on file    Gets together: Not on file    Attends religious service: Not on file    Active member of club or organization: Not on file    Attends meetings of clubs or organizations: Not on file    Relationship status: Not on file  . Intimate partner violence    Fear of current or ex partner: Not on file    Emotionally abused: Not on file    Physically abused: Not on file    Forced sexual activity: Not on file  Other Topics Concern  . Not on file  Social History Narrative  . Not on file     FAMILY HISTORY:  No family history on file.  Otherwise negative.   REVIEW OF SYSTEMS:  Review of Systems  Constitutional: Negative for chills and fever.  Respiratory: Negative for cough and shortness of breath.   Cardiovascular: Negative for chest pain and palpitations.  Gastrointestinal: Positive for abdominal pain,  nausea and vomiting. Negative for blood in stool, constipation and diarrhea.  Neurological: Negative for dizziness and headaches.  All other systems reviewed and are negative.   VITAL SIGNS:  Temp:  [98.5 F (36.9 C)] 98.5 F (36.9 C) (09/22 2231) Pulse Rate:  [87-100] 89 (09/23 0800) Resp:  [16-20] 16 (09/23 0500) BP: (140-155)/(69-84) 155/83 (09/23 0800) SpO2:  [89 %-97 %] 92 % (09/23 0800)             PHYSICAL EXAM:  Physical Exam Vitals signs and nursing note reviewed.  Constitutional:      General: He is not in acute distress.    Appearance: He is well-developed. He is obese. He is not ill-appearing.  HENT:     Head: Normocephalic and atraumatic.  Eyes:     General: No scleral icterus.     Extraocular Movements: Extraocular movements intact.  Cardiovascular:     Rate and Rhythm: Normal rate and regular rhythm.     Heart sounds: Normal heart sounds. No murmur. No friction rub. No gallop.   Pulmonary:     Effort: Pulmonary effort is normal. No respiratory distress.     Breath sounds: Normal breath sounds. No wheezing or rhonchi.  Abdominal:     General: Abdomen is flat. There is no distension.     Palpations: Abdomen is soft.     Tenderness: There is abdominal tenderness in the right upper quadrant and epigastric area. There is no guarding or rebound. Positive signs include Murphy's sign.     Hernia: No hernia is present.  Genitourinary:    Comments: Deferred Skin:    General: Skin is warm and dry.     Coloration: Skin is not jaundiced or pale.  Neurological:     General: No focal deficit present.     Mental Status: He is alert and oriented to person, place, and time.  Psychiatric:        Mood and Affect: Mood normal.        Behavior: Behavior normal.     INTAKE/OUTPUT:  This shift: No intake/output data recorded.  Last 2 shifts: @IOLAST2SHIFTS @  Labs:  CBC Latest Ref Rng & Units 11/06/2018  WBC 4.0 - 10.5 K/uL 20.7(H)  Hemoglobin 13.0 - 17.0 g/dL 16.1  Hematocrit 39.0 - 52.0 % 45.7  Platelets 150 - 400 K/uL 258   CMP Latest Ref Rng & Units 11/06/2018  Glucose 70 - 99 mg/dL 344(H)  BUN 6 - 20 mg/dL 10  Creatinine 0.61 - 1.24 mg/dL 0.61  Sodium 135 - 145 mmol/L 132(L)  Potassium 3.5 - 5.1 mmol/L 3.6  Chloride 98 - 111 mmol/L 91(L)  CO2 22 - 32 mmol/L 26  Calcium 8.9 - 10.3 mg/dL 9.4  Total Protein 6.5 - 8.1 g/dL 7.7  Total Bilirubin 0.3 - 1.2 mg/dL 1.4(H)  Alkaline Phos 38 - 126 U/L 150(H)  AST 15 - 41 U/L 128(H)  ALT 0 - 44 U/L 164(H)     Imaging studies:   RUQ [place] (11/07/2018) personally reviewed appear consistent with cholecystitis and radiologist report reviewed:  IMPRESSION: 1. Findings consistent with acute cholecystitis. Distended gallbladder  filled with sludge and stones with wall thickening, pericholecystic fluid, and positive sonographic Murphy sign. 2. No biliary dilatation. 3. Hepatic steatosis.    Assessment/Plan: (ICD-10's: K81.0) 53 y.o. male with RUQ pain, leukocytosis, and elevated LFTs most likely attributable to acute cholecystitis, complicated by pertinent comorbidities including a history of DM.    - Admit to general surgery  - NPO +  IVF + IV ABx (Zosyn)   - pain control prn; antiemetics prn  - monitor abdominal examination  - trend CMP, CBC  - medical management of comorbidities    - Will plan on laparoscopic cholecystectomy with Dr Everlene Farrier this afternoon pending OR/Anesthesia availability.   - All risks, benefits, and alternatives to above procedure(s) were discussed with the patient, all of his questions were answered to his expressed satisfaction, patient expresses he wishes to proceed, and informed consent was obtained.  All of the above findings and recommendations were discussed with the patient and the medical team, and all of his questions were answered to his expressed satisfaction.  -- Lynden Oxford, PA-C  [facility] 11/07/2018, 8:16 AM [phone] M-F: 7am - 4pm

## 2018-11-07 NOTE — Progress Notes (Signed)
Inpatient Diabetes Program Recommendations  AACE/ADA: New Consensus Statement on Inpatient Glycemic Control (2015)  Target Ranges:  Prepandial:   less than 140 mg/dL      Peak postprandial:   less than 180 mg/dL (1-2 hours)      Critically ill patients:  140 - 180 mg/dL   Lab Results  Component Value Date   GLUCAP 366 (H) 11/07/2018    Review of Glycemic Control  Diabetes history: None Current orders for Inpatient glycemic control:  Levemir 5 units Novolog 0-20 units + hs Novolog 6 units tid meal coverage (Pt NPO)  Inpatient Diabetes Program Recommendations:    Glucose trends elevated. A1c pending. Expecting it to be elevated. Will follow pt while here. Noted NPO for surgical intervention this afternoon.  Will follow when appropriate.  Thanks,  Tama Headings RN, MSN, BC-ADM Inpatient Diabetes Coordinator Team Pager [phone] (8a-5p)

## 2018-11-07 NOTE — Transfer of Care (Signed)
Immediate Anesthesia Transfer of Care Note  Patient: Derek George  Procedure(s) Performed: LAPAROSCOPIC CHOLECYSTECTOMY WITH INTRAOPERATIVE CHOLANGIOGRAM (N/A Abdomen)  Patient Location: PACU  Anesthesia Type:General  Level of Consciousness: sedated  Airway & Oxygen Therapy: Patient Spontanous Breathing and Patient connected to face mask oxygen  Post-op Assessment: Report given to RN and Post -op Vital signs reviewed and stable  Post vital signs: Reviewed and stable  Last Vitals:  Vitals Value Taken Time  BP 131/75 11/07/18 1320  Temp    Pulse 90 11/07/18 1323  Resp 21 11/07/18 1323  SpO2 97 % 11/07/18 1323  Vitals shown include unvalidated device data.  Last Pain:  Vitals:   11/07/18 1059  TempSrc: Tympanic  PainSc:          Complications: No apparent anesthesia complications

## 2018-11-07 NOTE — Anesthesia Procedure Notes (Signed)
Procedure Name: MAC Date/Time: 11/07/2018 11:34 AM Performed by: Justus Memory, CRNA Pre-anesthesia Checklist: Patient identified, Patient being monitored, Timeout performed, Emergency Drugs available and Suction available Patient Re-evaluated:Patient Re-evaluated prior to induction Oxygen Delivery Method: Circle system utilized Preoxygenation: Pre-oxygenation with 100% oxygen Induction Type: IV induction Ventilation: Mask ventilation without difficulty Laryngoscope Size: Mac and 3 Grade View: Grade II Tube type: Oral Tube size: 7.0 mm Number of attempts: 1 Airway Equipment and Method: Stylet Placement Confirmation: ETT inserted through vocal cords under direct vision,  positive ETCO2 and breath sounds checked- equal and bilateral Secured at: 21 cm Tube secured with: Tape Dental Injury: Teeth and Oropharynx as per pre-operative assessment

## 2018-11-07 NOTE — ED Notes (Signed)
Ultrasound at the bedside

## 2018-11-07 NOTE — Anesthesia Post-op Follow-up Note (Signed)
Anesthesia QCDR form completed.        

## 2018-11-07 NOTE — ED Notes (Signed)
Pt to OR with OR Tech on stretcher. Belongings - 1 pants, 1 shirt, 1 watch, 1 pair shoes placed in belongings bag and sent with pt to OR

## 2018-11-07 NOTE — ED Notes (Signed)
Pt resting on stretcher with lights off to enhance rest. Eyes closed and even respirations. Provided for comfort and safety and will continue to assess.

## 2018-11-07 NOTE — Anesthesia Postprocedure Evaluation (Signed)
Anesthesia Post Note  Patient: Derek George  Procedure(s) Performed: LAPAROSCOPIC CHOLECYSTECTOMY WITH INTRAOPERATIVE CHOLANGIOGRAM (N/A Abdomen)  Patient location during evaluation: PACU Anesthesia Type: General Level of consciousness: awake and alert and oriented Pain management: pain level controlled Vital Signs Assessment: post-procedure vital signs reviewed and stable Respiratory status: spontaneous breathing, nonlabored ventilation and respiratory function stable Cardiovascular status: blood pressure returned to baseline and stable Postop Assessment: no signs of nausea or vomiting Anesthetic complications: no     Last Vitals:  Vitals:   11/07/18 1335 11/07/18 1350  BP: 116/78 118/68  Pulse: 87 84  Resp: 18 15  Temp:    SpO2: 96% 95%    Last Pain:  Vitals:   11/07/18 1335  TempSrc:   PainSc: 0-No pain                 Sheryle Vice

## 2018-11-07 NOTE — ED Notes (Signed)
Pt resting quietly with eyes closed and even respirations. No distress noted. Lights off to enhance rest.

## 2018-11-07 NOTE — ED Notes (Signed)
Pt restless. Asking to get up to use restroom. Urinal bottle provided.

## 2018-11-08 ENCOUNTER — Encounter: Payer: Self-pay | Admitting: Surgery

## 2018-11-08 DIAGNOSIS — K8042 Calculus of bile duct with acute cholecystitis without obstruction: Secondary | ICD-10-CM

## 2018-11-08 LAB — COMPREHENSIVE METABOLIC PANEL
ALT: 293 U/L — ABNORMAL HIGH (ref 0–44)
AST: 302 U/L — ABNORMAL HIGH (ref 15–41)
Albumin: 3.1 g/dL — ABNORMAL LOW (ref 3.5–5.0)
Alkaline Phosphatase: 187 U/L — ABNORMAL HIGH (ref 38–126)
Anion gap: 10 (ref 5–15)
BUN: 22 mg/dL — ABNORMAL HIGH (ref 6–20)
CO2: 23 mmol/L (ref 22–32)
Calcium: 8.1 mg/dL — ABNORMAL LOW (ref 8.9–10.3)
Chloride: 103 mmol/L (ref 98–111)
Creatinine, Ser: 0.75 mg/dL (ref 0.61–1.24)
GFR calc Af Amer: >60 mL/min (ref >60)
GFR calc non Af Amer: >60 mL/min (ref >60)
Glucose, Bld: 248 mg/dL — ABNORMAL HIGH (ref 70–99)
Potassium: 4 mmol/L (ref 3.5–5.1)
Sodium: 136 mmol/L (ref 135–145)
Total Bilirubin: 4.4 mg/dL — ABNORMAL HIGH (ref 0.3–1.2)
Total Protein: 6 g/dL — ABNORMAL LOW (ref 6.5–8.1)

## 2018-11-08 LAB — CBC
HCT: 40.4 % (ref 39.0–52.0)
Hemoglobin: 13.9 g/dL (ref 13.0–17.0)
MCH: 30.5 pg (ref 26.0–34.0)
MCHC: 34.4 g/dL (ref 30.0–36.0)
MCV: 88.8 fL (ref 80.0–100.0)
Platelets: 203 10*3/uL (ref 150–400)
RBC: 4.55 MIL/uL (ref 4.22–5.81)
RDW: 12.9 % (ref 11.5–15.5)
WBC: 24.4 10*3/uL — ABNORMAL HIGH (ref 4.0–10.5)
nRBC: 0 % (ref 0.0–0.2)

## 2018-11-08 LAB — SURGICAL PATHOLOGY

## 2018-11-08 LAB — LIPASE, BLOOD: Lipase: 19 U/L (ref 11–51)

## 2018-11-08 LAB — GLUCOSE, CAPILLARY
Glucose-Capillary: 224 mg/dL — ABNORMAL HIGH (ref 70–99)
Glucose-Capillary: 226 mg/dL — ABNORMAL HIGH (ref 70–99)
Glucose-Capillary: 167 mg/dL — ABNORMAL HIGH (ref 70–99)
Glucose-Capillary: 205 mg/dL — ABNORMAL HIGH (ref 70–99)

## 2018-11-08 LAB — HIV ANTIBODY (ROUTINE TESTING W REFLEX): HIV Screen 4th Generation wRfx: NONREACTIVE

## 2018-11-08 MED ORDER — LIVING WELL WITH DIABETES BOOK
Freq: Once | Status: AC
  Administered 2018-11-08: 18:00:00
  Filled 2018-11-08 (×2): qty 1

## 2018-11-08 MED ORDER — INSULIN DETEMIR 100 UNIT/ML ~~LOC~~ SOLN
17.0000 [IU] | Freq: Every day | SUBCUTANEOUS | Status: DC
  Administered 2018-11-08 – 2018-11-09 (×2): 17 [IU] via SUBCUTANEOUS
  Filled 2018-11-08 (×3): qty 0.17

## 2018-11-08 NOTE — Progress Notes (Addendum)
[facility] Day(s): 1.   Post op day(s): 1 Day Post-Op.   Interval History: Patient seen and examined, no acute events or new complaints overnight. Patient reports that he is feeling pretty good. He reports incisional abdominal tenderness. No fever, chills, nausea, or emesis. He was tolerating CLD without issue post-op. Currently NPO. There was concern intra-operative that contrast during cholangiogram did not reach the duodenum and this morning T.Bili is elevated to 4.4 and he has had increase in his LFTs. Leukocytosis improved. JP in RUQ with serous output.   Vital signs in last 24 hours: [min-max] current  Temp:  [97.5 F (36.4 C)-98.4 F (36.9 C)] 97.5 F (36.4 C) (09/24 0424) Pulse Rate:  [76-93] 80 (09/24 0424) Resp:  [15-22] 18 (09/24 0424) BP: (107-155)/(53-85) 114/53 (09/24 0424) SpO2:  [90 %-97 %] 97 % (09/24 0424) Weight:  [86.2 kg] 86.2 kg (09/23 1458)     Height: 5\' 5"  (165.1 cm) Weight: 86.2 kg BMI (Calculated): 31.62   Intake/Output last 2 shifts:  09/23 0701 - 09/24 0700 In: 2877.8 [P.O.:360; I.V.:2467.7; IV Piggyback:50.1] Out: 980 [Urine:900; Drains:30; Blood:50]   Physical Exam:  Constitutional: alert, cooperative and no distress  HEENT: Sclera are mildly icteric Respiratory: breathing non-labored at rest  Cardiovascular: regular rate and sinus rhythm  Gastrointestinal: soft, incisional tenderness, and non-distended. No rebound/guarding. JP in RUQ with serous output.  Integumentary: Laparoscopic incisions are CDI with dermabond, no erythema or drainage.   Labs:  CBC Latest Ref Rng & Units 11/08/2018 11/07/2018 11/06/2018  WBC 4.0 - 10.5 K/uL 24.4(H) 29.6(H) 20.7(H)  Hemoglobin 13.0 - 17.0 g/dL 13.9 15.0 16.1  Hematocrit 39.0 - 52.0 % 40.4 42.6 45.7  Platelets 150 - 400 K/uL 203 209 258   CMP Latest Ref Rng & Units 11/08/2018 11/07/2018 11/06/2018  Glucose 70 - 99 mg/dL 248(H) 366(H) 344(H)  BUN 6 - 20 mg/dL  22(H) 13 10  Creatinine 0.61 - 1.24 mg/dL 0.75 0.81 0.61  Sodium 135 - 145 mmol/L 136 133(L) 132(L)  Potassium 3.5 - 5.1 mmol/L 4.0 4.5 3.6  Chloride 98 - 111 mmol/L 103 96(L) 91(L)  CO2 22 - 32 mmol/L 23 26 26   Calcium 8.9 - 10.3 mg/dL 8.1(L) 8.7(L) 9.4  Total Protein 6.5 - 8.1 g/dL 6.0(L) 6.9 7.7  Total Bilirubin 0.3 - 1.2 mg/dL 4.4(H) 1.4(H) 1.4(H)  Alkaline Phos 38 - 126 U/L 187(H) 124 150(H)  AST 15 - 41 U/L 302(H) 71(H) 128(H)  ALT 0 - 44 U/L 293(H) 124(H) 164(H)     Imaging studies: No new pertinent imaging studies   Assessment/Plan:  53 y.o. male with increasing bilirubin and LFTs concerning for retained CBD stone 1 Day Post-Op s/p laparoscopic cholecystectomy for gangrenous cholecystitis   - CLD today + IVF; NPO after midnight  - IV ABx (Zosyn)  - Pain control prn; antiemetics prn  - monitor abdominal examination; JP output   - Will consult GI, need evaluate for ERCP; plan for tomorrow; d/w Dr Marius Ditch  - Mobilization encouraged; IS sue  - medical management of comorbid conditions   All of the above findings and recommendations were discussed with the patient, and the medical team, and all of patient's questions were answered to his expressed satisfaction.  -- Edison Simon, PA-C [facility] 11/08/2018, 7:10 AM [phone] M-F: 7am - 4pm

## 2018-11-08 NOTE — Progress Notes (Signed)
Inpatient Diabetes Program Recommendations  AACE/ADA: New Consensus Statement on Inpatient Glycemic Control (2015)  Target Ranges:  Prepandial:   less than 140 mg/dL      Peak postprandial:   less than 180 mg/dL (1-2 hours)      Critically ill patients:  140 - 180 mg/dL   Lab Results  Component Value Date   GLUCAP 226 (H) 11/08/2018    Review of Glycemic Control  Diabetes history: No prior hx Current orders for Inpatient glycemic control:  Levemir 5 units Novolog 0-20 units + hs Novolog 6 units tid meal coverage (Pt NPO)  Inpatient Diabetes Program Recommendations:   -Increase Levemir to 17 units daily (0.2 units/kg x 86.2 kg)  A1c still pending.  Thank you, Nani Gasser. Abrish Erny, RN, MSN, CDE  Diabetes Coordinator Inpatient Glycemic Control Team Team Pager [phone] (8am-5pm) 11/08/2018 2:04 PM

## 2018-11-08 NOTE — Addendum Note (Signed)
Addendum  created 11/08/18 0654 by Justus Memory, CRNA   Intraprocedure Meds edited

## 2018-11-08 NOTE — Progress Notes (Signed)
Consent obtained for for ERCP tomorrow. Voiding. Ambulates in the hallway. JP drain output being monitored.

## 2018-11-08 NOTE — Consult Note (Signed)
° ° ° °Rohini R Vanga, MD °[address]  °[address]  °[place], [place] [address]  °Main: [phone]  °Fax: [phone] °Pager: [phone] ° ° Consultation ° °Referring Provider:     No ref. provider found °Primary Care Physician:  Patient, No Pcp Per °Primary Gastroenterologist: Unassigned         °Reason for Consultation:     Elevated LFTs ° °Date of Admission:  11/07/2018 °Date of Consultation:  11/08/2018 °       ° HPI:   °Derek George is a 53 y.o. male with history of diabetes, uncontrolled who was admitted yesterday with acute cholecystitis.  He presented to ER after acute onset of upper abdominal burning pain radiating to the back after eating a spicy meal.  He denies nausea or vomiting.  He had leukocytosis, elevated transaminases AST 128, ALT 164, alkaline phosphatase 150, total bilirubin 1.4.  Ultrasound confirmed presence of acute calculus cholecystitis and no biliary dilation.  Patient underwent laparoscopic cholecystectomy yesterday by Dr. Pavone and Intra-Op cholangiogram revealed active contrast not reaching the duodenum.  Subsequently, this morning his LFTs elevated including total bilirubin 4.4, AST 302, ALT 293, alkaline phosphatase 187.  Patient reports mild right upper quadrant pain only.  He is tolerating liquid diet.  Serum lipase on admission was normal.  He is currently on Zosyn ° °Patient does not drink alcohol ° °NSAIDs: None ° °Antiplts/Anticoagulants/Anti thrombotics: None ° °GI Procedures: None ° °Past Medical History:  °Diagnosis Date  °• Diabetes mellitus without complication (HCC)   ° ° °Past Surgical History:  °Procedure Laterality Date  °• APPENDECTOMY    °• CHOLECYSTECTOMY N/A 11/07/2018  ° Procedure: LAPAROSCOPIC CHOLECYSTECTOMY WITH INTRAOPERATIVE CHOLANGIOGRAM;  Surgeon: Pabon, Diego F, MD;  Location: [facility] ORS;  Service: General;  Laterality: N/A;  ° ° °Prior to Admission medications   °Medication Sig Start Date End Date Taking? Authorizing Provider  °omeprazole  (PRILOSEC) 20 MG capsule Take 20 mg by mouth daily.   Yes [provider]  ° ° °Current Facility-Administered Medications:  °•  0.9 %  sodium chloride infusion, , Intravenous, Continuous, Pabon, Diego F, MD, Last Rate: 100 mL/hr at 11/08/18 1221 °•  acetaminophen (TYLENOL) tablet 1,000 mg, 1,000 mg, Oral, Q6H, Pabon, Diego F, MD, 1,000 mg at 11/08/18 1141 °•  diphenhydrAMINE (BENADRYL) capsule 25 mg, 25 mg, Oral, Q6H PRN **OR** diphenhydrAMINE (BENADRYL) injection 25 mg, 25 mg, Intravenous, Q6H PRN, Pabon, Diego F, MD °•  heparin injection 5,000 Units, 5,000 Units, Subcutaneous, Q8H, Pabon, Diego F, MD, 5,000 Units at 11/08/18 1404 °•  hydrALAZINE (APRESOLINE) injection 10 mg, 10 mg, Intravenous, Q2H PRN, Pabon, Diego F, MD °•  insulin aspart (novoLOG) injection 0-20 Units, 0-20 Units, Subcutaneous, TID WC, Pabon, Diego F, MD, 7 Units at 11/08/18 1154 °•  insulin aspart (novoLOG) injection 0-5 Units, 0-5 Units, Subcutaneous, QHS, Pabon, Diego F, MD, 4 Units at 11/07/18 2218 °•  insulin aspart (novoLOG) injection 6 Units, 6 Units, Subcutaneous, TID WC, Pabon, Diego F, MD, 6 Units at 11/08/18 1154 °•  insulin detemir (LEVEMIR) injection 5 Units, 5 Units, Subcutaneous, QHS, Pabon, Diego F, MD, 5 Units at 11/07/18 2217 °•  ketorolac (TORADOL) 30 MG/ML injection 30 mg, 30 mg, Intravenous, Q6H, Pabon, Diego F, MD, 30 mg at 11/08/18 1141 °•  living well with diabetes book MISC, , Does not apply, Once, Pabon, Diego F, MD °•  morphine 2 MG/ML injection 2 mg, 2 mg, Intravenous, Q3H PRN, Pabon, Diego F, MD °•  ondansetron (ZOFRAN-ODT)   ZOFRAN-ODT) disintegrating tablet 4 mg, 4 mg, Oral, Q6H PRN **OR** ondansetron (ZOFRAN) injection 4 mg, 4 mg, Intravenous, Q6H PRN, Pabon, Diego F, MD, 4 mg at 11/07/18 1257   oxyCODONE (Oxy IR/ROXICODONE) immediate release tablet 5-10 mg, 5-10 mg, Oral, Q4H PRN, Pabon, Diego F, MD   pantoprazole (PROTONIX) injection 40 mg, 40 mg, Intravenous, QHS, Pabon, Diego F, MD, 40 mg at 11/07/18 2217    piperacillin-tazobactam (ZOSYN) IVPB 3.375 g, 3.375 g, Intravenous, Q8H, Pabon, Diego F, MD, Last Rate: 12.5 mL/hr at 11/08/18 1143, 3.375 g at 11/08/18 1143   prochlorperazine (COMPAZINE) tablet 10 mg, 10 mg, Oral, Q6H PRN **OR** prochlorperazine (COMPAZINE) injection 5-10 mg, 5-10 mg, Intravenous, Q6H PRN, Pabon, Diego F, MD   sodium chloride 0.9 % bolus 1,000 mL, 1,000 mL, Intravenous, Once, Pabon, Marjory Lies, MD  History reviewed. No pertinent family history.   Social History   Tobacco Use   Smoking status: Never Smoker   Smokeless tobacco: Never Used  Substance Use Topics   Alcohol use: Never    Frequency: Never   Drug use: Not on file    Allergies as of 11/06/2018   (No Known Allergies)    Review of Systems:    All systems reviewed and negative except where noted in HPI.   Physical Exam:  Vital signs in last 24 hours: Temp:  [97.5 F (36.4 C)-98.4 F (36.9 C)] 97.6 F (36.4 C) (09/24 1200) Pulse Rate:  [76-86] 81 (09/24 1200) Resp:  [15-20] 18 (09/24 1200) BP: (107-140)/(53-79) 140/79 (09/24 1200) SpO2:  [93 %-100 %] 100 % (09/24 1200) Weight:  [86.2 kg] 86.2 kg (09/23 1458) Last BM Date: 11/07/18 General:   Pleasant, cooperative in NAD Head:  Normocephalic and atraumatic. Eyes:   No icterus.   Conjunctiva pink. PERRLA. Ears:  Normal auditory acuity. Neck:  Supple; no masses or thyroidomegaly Lungs: Respirations even and unlabored. Lungs clear to auscultation bilaterally.   No wheezes, crackles, or rhonchi.  Heart:  Regular rate and rhythm;  Without murmur, clicks, rubs or gallops Abdomen:  Soft, nondistended, nontender. Normal bowel sounds. No appreciable masses or hepatomegaly.  No rebound or guarding.  Drain in the right upper quadrant, serosanguineous discharge, dressing at the incision site from laparoscopy Rectal:  Not performed. Msk:  Symmetrical without gross deformities.  Strength normal Extremities:  Without edema, cyanosis or clubbing. Neurologic:   Alert and oriented x3;  grossly normal neurologically. Skin:  Intact without significant lesions or rashes. Psych:  Alert and cooperative. Normal affect.  LAB RESULTS: CBC Latest Ref Rng & Units 11/08/2018 11/07/2018 11/06/2018  WBC 4.0 - 10.5 K/uL 24.4(H) 29.6(H) 20.7(H)  Hemoglobin 13.0 - 17.0 g/dL 13.9 15.0 16.1  Hematocrit 39.0 - 52.0 % 40.4 42.6 45.7  Platelets 150 - 400 K/uL 203 209 258    BMET BMP Latest Ref Rng & Units 11/08/2018 11/07/2018 11/06/2018  Glucose 70 - 99 mg/dL 248(H) 366(H) 344(H)  BUN 6 - 20 mg/dL 22(H) 13 10  Creatinine 0.61 - 1.24 mg/dL 0.75 0.81 0.61  Sodium 135 - 145 mmol/L 136 133(L) 132(L)  Potassium 3.5 - 5.1 mmol/L 4.0 4.5 3.6  Chloride 98 - 111 mmol/L 103 96(L) 91(L)  CO2 22 - 32 mmol/L _0 Calcium 8.9 - 10.3 mg/dL 8.1(L) 8.7(L) 9.4    LFT Hepatic Function Latest Ref Rng & Units 11/08/2018 11/07/2018 11/06/2018  Total Protein 6.5 - 8.1 g/dL 6.0(L) 6.9 7.7  Albumin 3.5 - 5.0 g/dL 3.1(L) 3.7 4.1  AST 15 -  41 U/L 302(H) 71(H) 128(H)  ALT 0 - 44 U/L 293(H) 124(H) 164(H)  Alk Phosphatase 38 - 126 U/L 187(H) 124 150(H)  Total Bilirubin 0.3 - 1.2 mg/dL 4.4(H) 1.4(H) 1.4(H)  Bilirubin, Direct 0.0 - 0.2 mg/dL - 0.4(H) -     STUDIES: Dg Cholangiogram Operative  Result Date: 11/07/2018 CLINICAL DATA:  Abdominal pain, acute cholecystitis EXAM: INTRAOPERATIVE CHOLANGIOGRAM TECHNIQUE: Cholangiographic images from the C-arm fluoroscopic device were submitted for interpretation post-operatively. Please see the procedural report for the amount of contrast and the fluoroscopy time utilized. COMPARISON:  Ultrasound from earlier the same day FINDINGS: No persistent filling defects in the common duct. Intrahepatic ducts are incompletely visualized, appearing decompressed centrally. Contrast passes into the duodenum. : Negative for retained common duct stone. Electronically Signed   By: Lucrezia Europe M.D.   On: 11/07/2018 12:47   US Abdomen Limited Ruq  Result Date:  11/07/2018 CLINICAL DATA:  Upper abdominal pain radiating to the back since yesterday. EXAM: ULTRASOUND ABDOMEN LIMITED RIGHT UPPER QUADRANT COMPARISON:  None. FINDINGS: Gallbladder: Distended containing hyperechoic intraluminal contents, likely combination of sludge and stones. Intraluminal contents lead to posterior shadowing partially obscuring evaluation of the posterior wall. There is gallbladder wall thickening at 6 mm. Small amount of pericholecystic fluid. Positive sonographic Murphy sign noted by sonographer. Common bile duct: Diameter: 3 mm. Liver: No focal lesion identified. Heterogeneously increased in parenchymal echogenicity. Liver parenchyma is difficult to penetrate. Portal vein is patent on color Doppler imaging with normal direction of blood flow towards the liver. Other: None. IMPRESSION: 1. Findings consistent with acute cholecystitis. Distended gallbladder filled with sludge and stones with wall thickening, pericholecystic fluid, and positive sonographic Murphy sign. 2. No biliary dilatation. 3. Hepatic steatosis. Electronically Signed   By: Keith Rake M.D.   On: 11/07/2018 02:29      Impression / Plan:   Derek George is a 53 y.o. male with history of diabetes, admitted with acute calculus cholecystitis status post laparoscopic cholecystectomy, CBD obstruction based on Intra-Op cholangiogram, worsening LFTs including T bilirubin after cholecystectomy concern for choledocholithiasis.  Afebrile, has significant leukocytosis  Continue Zosyn Obtain blood cultures Monitor LFTs Check serum lipase Dr. Allen Norris will perform ERCP tomorrow N.p.o. past midnight  Thank you for involving me in the care of this patient.  GI will follow along with you    LOS: 1 day   Sherri Sear, MD  11/08/2018, 2:16 PM   Note: This dictation was prepared with Dragon dictation along with smaller phrase technology. Any transcriptional errors that result from this process are unintentional.

## 2018-11-09 ENCOUNTER — Inpatient Hospital Stay: Payer: Self-pay | Admitting: Anesthesiology

## 2018-11-09 ENCOUNTER — Encounter
Admission: EM | Disposition: A | Discharge status: Home / Self Care | Payer: Self-pay | Source: Home / Self Care | Attending: Surgery

## 2018-11-09 ENCOUNTER — Inpatient Hospital Stay: Payer: Self-pay

## 2018-11-09 DIAGNOSIS — R748 Abnormal levels of other serum enzymes: Secondary | ICD-10-CM

## 2018-11-09 HISTORY — PX: ERCP: SHX5425

## 2018-11-09 LAB — GLUCOSE, CAPILLARY
Glucose-Capillary: 108 mg/dL — ABNORMAL HIGH (ref 70–99)
Glucose-Capillary: 109 mg/dL — ABNORMAL HIGH (ref 70–99)
Glucose-Capillary: 135 mg/dL — ABNORMAL HIGH (ref 70–99)
Glucose-Capillary: 164 mg/dL — ABNORMAL HIGH (ref 70–99)
Glucose-Capillary: 278 mg/dL — ABNORMAL HIGH (ref 70–99)

## 2018-11-09 LAB — HEMOGLOBIN A1C
Hgb A1c MFr Bld: 12.4 % — ABNORMAL HIGH (ref 4.8–5.6)
Mean Plasma Glucose: 309 mg/dL

## 2018-11-09 SURGERY — ERCP, WITH INTERVENTION IF INDICATED
Anesthesia: General

## 2018-11-09 MED ORDER — SUCCINYLCHOLINE CHLORIDE 20 MG/ML IJ SOLN
INTRAMUSCULAR | Status: AC
  Filled 2018-11-09: qty 1

## 2018-11-09 MED ORDER — PROPOFOL 10 MG/ML IV BOLUS
INTRAVENOUS | Status: AC
  Filled 2018-11-09: qty 20

## 2018-11-09 MED ORDER — FENTANYL CITRATE (PF) 100 MCG/2ML IJ SOLN
25.0000 ug | INTRAMUSCULAR | Status: DC | PRN
  Administered 2018-11-09 (×2): 50 ug via INTRAVENOUS

## 2018-11-09 MED ORDER — INSULIN STARTER KIT- SYRINGES (SPANISH)
1.0000 | Freq: Once | Status: AC
  Administered 2018-11-09: 1
  Filled 2018-11-09: qty 1

## 2018-11-09 MED ORDER — DEXAMETHASONE SODIUM PHOSPHATE 10 MG/ML IJ SOLN
INTRAMUSCULAR | Status: DC | PRN
  Administered 2018-11-09: 4 mg via INTRAVENOUS

## 2018-11-09 MED ORDER — INDOMETHACIN 50 MG RE SUPP
RECTAL | Status: AC
  Filled 2018-11-09: qty 2

## 2018-11-09 MED ORDER — ONDANSETRON HCL 4 MG/2ML IJ SOLN
INTRAMUSCULAR | Status: AC
  Filled 2018-11-09: qty 2

## 2018-11-09 MED ORDER — GLYCOPYRROLATE 0.2 MG/ML IJ SOLN
INTRAMUSCULAR | Status: DC | PRN
  Administered 2018-11-09: 0.2 mg via INTRAVENOUS

## 2018-11-09 MED ORDER — LIDOCAINE HCL (CARDIAC) PF 100 MG/5ML IV SOSY
PREFILLED_SYRINGE | INTRAVENOUS | Status: DC | PRN
  Administered 2018-11-09: 100 mg via INTRAVENOUS

## 2018-11-09 MED ORDER — ONDANSETRON HCL 4 MG/2ML IJ SOLN
4.0000 mg | Freq: Once | INTRAMUSCULAR | Status: AC | PRN
  Administered 2018-11-09: 12:00:00 4 mg via INTRAVENOUS

## 2018-11-09 MED ORDER — SUCCINYLCHOLINE CHLORIDE 20 MG/ML IJ SOLN
INTRAMUSCULAR | Status: DC | PRN
  Administered 2018-11-09: 100 mg via INTRAVENOUS

## 2018-11-09 MED ORDER — PROPOFOL 10 MG/ML IV BOLUS
INTRAVENOUS | Status: DC | PRN
  Administered 2018-11-09: 150 mg via INTRAVENOUS

## 2018-11-09 MED ORDER — PHENOL 1.4 % MT LIQD
1.0000 | OROMUCOSAL | Status: DC | PRN
  Filled 2018-11-09: qty 177

## 2018-11-09 MED ORDER — PHENYLEPHRINE HCL (PRESSORS) 10 MG/ML IV SOLN
INTRAVENOUS | Status: DC | PRN
  Administered 2018-11-09: 50 ug via INTRAVENOUS
  Administered 2018-11-09 (×2): 100 ug via INTRAVENOUS
  Administered 2018-11-09: 50 ug via INTRAVENOUS

## 2018-11-09 MED ORDER — INDOMETHACIN 50 MG RE SUPP
100.0000 mg | Freq: Once | RECTAL | Status: AC
  Administered 2018-11-09: 100 mg via RECTAL

## 2018-11-09 MED ORDER — GLYCOPYRROLATE 0.2 MG/ML IJ SOLN
INTRAMUSCULAR | Status: AC
  Filled 2018-11-09: qty 1

## 2018-11-09 MED ORDER — LIDOCAINE HCL (PF) 2 % IJ SOLN
INTRAMUSCULAR | Status: AC
  Filled 2018-11-09: qty 10

## 2018-11-09 MED ORDER — MIDAZOLAM HCL 2 MG/2ML IJ SOLN
INTRAMUSCULAR | Status: DC | PRN
  Administered 2018-11-09: 2 mg via INTRAVENOUS

## 2018-11-09 MED ORDER — DEXAMETHASONE SODIUM PHOSPHATE 4 MG/ML IJ SOLN
INTRAMUSCULAR | Status: AC
  Filled 2018-11-09: qty 1

## 2018-11-09 MED ORDER — LACTATED RINGERS IV SOLN
INTRAVENOUS | Status: DC
  Administered 2018-11-09: 11:00:00 1000 mL via INTRAVENOUS

## 2018-11-09 MED ORDER — FENTANYL CITRATE (PF) 100 MCG/2ML IJ SOLN
INTRAMUSCULAR | Status: AC
  Filled 2018-11-09: qty 2

## 2018-11-09 MED ORDER — PIPERACILLIN-TAZOBACTAM 3.375 G IVPB
3.3750 g | Freq: Three times a day (TID) | INTRAVENOUS | Status: DC
  Administered 2018-11-10 (×2): 3.375 g via INTRAVENOUS
  Filled 2018-11-09: qty 50

## 2018-11-09 MED ORDER — MIDAZOLAM HCL 2 MG/2ML IJ SOLN
INTRAMUSCULAR | Status: AC
  Filled 2018-11-09: qty 2

## 2018-11-09 NOTE — Progress Notes (Addendum)
MD- CBG 108 mg/dl this AM.  Please consider reducing Levemir to 12 units QHS  Not sure patient will be willing to take insulin at home.  Self-stopped Glipizide and Januvia months ago b/c he stated the meds made him feel bad (per wife report).  Wife states pt doesn't want to take insulin.  Needs meds for home since A1c 12.4%.     2 Days Post-Ops/p laparoscopic cholecystectomyfor gangrenous cholecystitis.    NPO for ERCP today.    Per Record Review, last seen by MD at the [facility] on 01/16/2017 for Diabetes management.  Was taking Glipizide and Januvia at that time.  No Insurance listed--No PCP listed.     Met with pt's wife this afternoon.  Pt sleeping soundly after procedure and could not stay awake to have conversation with me.  Used [employer] interpreter for [place].  Wife told me pt hasn't seen PCP at [facility] center in over 6 months.  Stopped taking Glipizide and Januvia b/c he stated they made his head hurt and made him feel bad per wife's report.  Has been trying to eat healthfully at home.  Wife told me pt is "always in a bad mood" and asked me if this could contribute to high CBGs.  Has never taken insulin at home and per wife, likely would refuse to take insulin at home b/c pt thinks insulin is harmful.  Does not have CBG meter at home.  Spoke with patient's wife about pt's current A1c of 12.4%.  Explained what an A1c is and what it measures.  Reminded patient's wife that pt's goal A1c is 7% or less per ADA standards to prevent both acute and long-term complications.  Explained to patient's wife the extreme importance of good glucose control at home especially for healing post-op.  Encouraged patient's wife to have pt to check his CBGs at least bid at home (fasting and another check within the day) and to record all CBGs in a logbook for his PCP to review.  Also reviewed CBG goals for home with pt's wife.  Encouraged pt's wife to go to  [employer] and buy pt a CBG meter OTC for low cost.  Also strongly encouraged pt's wife to encourage pt to get follow up care with another PCP if he does not want to go back to his previous PCP at the [facility] center.  Will order RD consult for DM diet education reinforcement and have asked RNs to begin practicing insulin injections with pt in case decision made to d/c pt home on insulin.   .--Will follow patient during hospitalization--  Wyn Quaker RN, MSN, CDE Diabetes Coordinator Inpatient Glycemic Control Team Team Pager: [phone] (8a-5p)

## 2018-11-09 NOTE — Op Note (Signed)
[facility] Patient Name: Raylan Hanton Procedure Date: 11/09/2018 11:42 AM MRN: [id number] Account #: [id number] Date of Birth: 1965-07-30 Admit Type: Inpatient Age: 53 Room: [facility] ENDO ROOM 4 Gender: Male Note Status: Finalized Procedure:            ERCP Indications:          Elevated liver enzymes Providers:            Midge Minium MD, MD Medicines:            General Anesthesia Complications:        No immediate complications. Procedure:            Pre-Anesthesia Assessment:                       - Prior to the procedure, a History and Physical was                        performed, and patient medications and allergies were                        reviewed. The patient's tolerance of previous                        anesthesia was also reviewed. The risks and benefits of                        the procedure and the sedation options and risks were                        discussed with the patient. All questions were                        answered, and informed consent was obtained. Prior                        Anticoagulants: The patient has taken no previous                        anticoagulant or antiplatelet agents. ASA Grade                        Assessment: II - A patient with mild systemic disease.                        After reviewing the risks and benefits, the patient was                        deemed in satisfactory condition to undergo the                        procedure.                       After obtaining informed consent, the scope was passed                        under direct vision. Throughout the procedure, the                        patient's blood pressure, pulse, and oxygen  saturations                        were monitored continuously. The Duodenoscope was                        introduced through the mouth, and used to inject                        contrast into and used to inject contrast into the       dorsal pancreatic duct. The ERCP was accomplished                        without difficulty. The patient tolerated the procedure                        well. Findings:      A scout film of the abdomen was obtained. Surgical clips, consistent       with previous cholecystectomy, were seen in the area of the cystic duct.       The esophagus was successfully intubated under direct vision. The scope       was advanced to a normal major papilla in the descending duodenum       without detailed examination of the pharynx, larynx and associated       structures, and upper GI tract. The upper GI tract was grossly normal.       The bile duct was deeply cannulated with the short-nosed traction       sphincterotome. Contrast was injected. I personally interpreted the bile       duct images. There was brisk flow of contrast through the ducts. Image       quality was excellent. Contrast extended to the entire biliary tree. The       middle third of the main bile duct contained filling defect(s) thought       to be sludge. Cannulation was done with a double wire technique. A       straight Roadrunner wire was passed into the biliary tree. A 10 mm       biliary sphincterotomy was made with a traction (standard)       sphincterotome using ERBE electrocautery. There was no       post-sphincterotomy bleeding. The biliary tree was swept with a 15 mm       balloon starting at the bifurcation. Sludge was swept from the duct. Impression:           - The examination was suspicious for sludge.                       - A biliary sphincterotomy was performed.                       - The biliary tree was swept and sludge was found. Recommendation:       - Return patient to hospital ward for ongoing care.                       - Clear liquid diet.                       - Watch for pancreatitis, bleeding, perforation, and  cholangitis. Procedure Code(s):    --- Professional ---                        [phone], Endoscopic retrograde cholangiopancreatography                        (ERCP); with removal of calculi/debris from                        biliary/pancreatic duct(s)                       [id number], Endoscopic retrograde cholangiopancreatography                        (ERCP); with sphincterotomy/papillotomy                       [phone], Endoscopic catheterization of the biliary ductal                        system, radiological supervision and interpretation Diagnosis Code(s):    --- Professional ---                       R74.8, Abnormal levels of other serum enzymes CPT copyright 2019 American Medical Association. All rights reserved. The codes documented in this report are preliminary and upon coder review may  be revised to meet current compliance requirements. Lucilla Lame MD, MD 11/09/2018 12:21:45 PM This report has been signed electronically. Number of Addenda: 0 Note Initiated On: 11/09/2018 11:42 AM Estimated Blood Loss: Estimated blood loss: none.      [facility]

## 2018-11-09 NOTE — Progress Notes (Signed)
Inpatient Diabetes Program Recommendations  AACE/ADA: New Consensus Statement on Inpatient Glycemic Control (2015)  Target Ranges:  Prepandial:   less than 140 mg/dL      Peak postprandial:   less than 180 mg/dL (1-2 hours)      Critically ill patients:  140 - 180 mg/dL   Results for Derek George, Derek George (MRN [id number]) as of 11/09/2018 10:36  Ref. Range 11/08/2018 07:34 11/08/2018 11:43 11/08/2018 16:01 11/08/2018 20:34  Glucose-Capillary Latest Ref Range: 70 - 99 mg/dL 224 (H)  13 units NOVOLOG  226 (H)  13 units NOVOLOG  167 (H)  10 units NOVOLOG  205 (H)  2 units NOVOLOG +  17 units LEVEMIR   Results for Derek George, Derek George (MRN [id number]) as of 11/09/2018 10:36  Ref. Range 11/09/2018 07:54  Glucose-Capillary Latest Ref Range: 70 - 99 mg/dL 108 (H)   Results for Derek George, Derek George (MRN [id number]) as of 11/09/2018 10:36  Ref. Range 11/07/2018 08:45  Hemoglobin A1C Latest Ref Range: 4.8 - 5.6 % 12.4 (H)  (309 mg/dl)    Admit with: Acute Cholecystitis   History: DM  Home DM Meds: ??  Current Orders: Levemir 17 units QHS      Novolog Resistant Correction Scale/ SSI (0-20 units) TID AC + HS      Novolog 6 units TID with meals      MD- CBG 108 mg/dl this AM.  Please consider reducing Levemir to 12 units QHS     2 Days Post-Op s/p laparoscopic cholecystectomy for gangrenous cholecystitis.    NPO for ERCP today.    Per Record Review, last seen by MD at the [facility] on 01/16/2017 for Diabetes management.  Was taking Glipizide and Januvia at that time.  No Insurance listed--No PCP listed.     --Will follow patient during hospitalization--  Wyn Quaker RN, MSN, CDE Diabetes Coordinator Inpatient Glycemic Control Team Team Pager: [phone] (8a-5p)

## 2018-11-09 NOTE — Anesthesia Postprocedure Evaluation (Signed)
Anesthesia Post Note  Patient: Derek George  Procedure(s) Performed: ENDOSCOPIC RETROGRADE CHOLANGIOPANCREATOGRAPHY (ERCP) (N/A )  Patient location during evaluation: Endoscopy Anesthesia Type: General Level of consciousness: awake and alert and oriented Pain management: pain level controlled Vital Signs Assessment: post-procedure vital signs reviewed and stable Respiratory status: spontaneous breathing, nonlabored ventilation and respiratory function stable Cardiovascular status: blood pressure returned to baseline and stable Postop Assessment: no signs of nausea or vomiting Anesthetic complications: no     Last Vitals:  Vitals:   11/09/18 1315 11/09/18 1320  BP:  (!) 176/94  Pulse: 67 65  Resp: 11   Temp:  36.7 C  SpO2: 95% 93%    Last Pain:  Vitals:   11/09/18 1320  TempSrc: Oral  PainSc:                  Keola Heninger

## 2018-11-09 NOTE — Progress Notes (Signed)
[facility] Hospital Day(s): 2.   Post op day(s): 2 Days Post-Op.   Interval History: Patient seen and examined, no acute events or new complaints overnight. Patient reports he is feeling better. No significant complaints. Plan for ERCP today with Dr Allen Norris. JP with 60 ccs out in last 24 hours.   Vital signs in last 24 hours: [min-max] current  Temp:  [97.6 F (36.4 C)-98.6 F (37 C)] 98.6 F (37 C) (09/25 0627) Pulse Rate:  [63-81] 63 (09/25 0627) Resp:  [18-20] 20 (09/25 0627) BP: (133-155)/(75-89) 148/89 (09/25 0627) SpO2:  [93 %-100 %] 94 % (09/25 0627)     Height: 5\' 5"  (165.1 cm) Weight: 86.2 kg BMI (Calculated): 31.62   Intake/Output last 2 shifts:  09/24 0701 - 09/25 0700 In: 3326.7 [P.O.:720; I.V.:2340.6; IV Piggyback:266] Out: 2878 [Urine:1500; Drains:65]   Physical Exam:  Constitutional: alert, cooperative and no distress  HEENT: Sclera are mildly icteric Respiratory: breathing non-labored at rest  Cardiovascular: regular rate and sinus rhythm  Gastrointestinal: soft, incisional tenderness, and non-distended. No rebound/guarding. JP in RUQ with serous output.  Integumentary: Laparoscopic incisions are CDI with dermabond, no erythema or drainage.   Labs:  CBC Latest Ref Rng & Units 11/08/2018 11/07/2018 11/06/2018  WBC 4.0 - 10.5 K/uL 24.4(H) 29.6(H) 20.7(H)  Hemoglobin 13.0 - 17.0 g/dL 13.9 15.0 16.1  Hematocrit 39.0 - 52.0 % 40.4 42.6 45.7  Platelets 150 - 400 K/uL 203 209 258   CMP Latest Ref Rng & Units 11/08/2018 11/07/2018 11/06/2018  Glucose 70 - 99 mg/dL 248(H) 366(H) 344(H)  BUN 6 - 20 mg/dL 22(H) 13 10  Creatinine 0.61 - 1.24 mg/dL 0.75 0.81 0.61  Sodium 135 - 145 mmol/L 136 133(L) 132(L)  Potassium 3.5 - 5.1 mmol/L 4.0 4.5 3.6  Chloride 98 - 111 mmol/L 103 96(L) 91(L)  CO2 22 - 32 mmol/L 23 26 26   Calcium 8.9 - 10.3 mg/dL 8.1(L) 8.7(L) 9.4  Total Protein 6.5 - 8.1 g/dL 6.0(L) 6.9 7.7  Total Bilirubin 0.3 - 1.2  mg/dL 4.4(H) 1.4(H) 1.4(H)  Alkaline Phos 38 - 126 U/L 187(H) 124 150(H)  AST 15 - 41 U/L 302(H) 71(H) 128(H)  ALT 0 - 44 U/L 293(H) 124(H) 164(H)     Imaging studies: No new pertinent imaging studies   Assessment/Plan:  53 y.o. male doing wll 2 Days Post-Op s/p laparoscopic cholecystectomy for gangrenous cholecystitis and concern for retained CBD stone on intra-operative cholangiogram, complicated by pertinent comorbidities including history of DM.   - NPO this morning for procedure + IVF  - Continue IVF ABx (Zosyn)  - Plan for ERCP with Dr Allen Norris today  - pain control prn  - monitor abdominal examination; JP output   - Mobilization encouraged; IS sue             - medical management of comorbid conditions   All of the above findings and recommendations were discussed with the patient, and the medical team, and all of patient's questions were answered to his expressed satisfaction.  -- Edison Simon, PA-C [facility] 11/09/2018, 7:46 AM [phone] M-F: 7am - 4pm

## 2018-11-09 NOTE — Anesthesia Post-op Follow-up Note (Signed)
Anesthesia QCDR form completed.        

## 2018-11-09 NOTE — Progress Notes (Signed)
RN performed teaching at the bedside for insulin administration. Patient to practice giving insulin shots on himself per diabetes coordinator.

## 2018-11-09 NOTE — Anesthesia Preprocedure Evaluation (Signed)
Anesthesia Evaluation  Patient identified by MRN, date of birth, ID band Patient awake    Reviewed: Allergy & Precautions, NPO status , Patient's Chart, lab work & pertinent test results  History of Anesthesia Complications Negative for: history of anesthetic complications  Airway Mallampati: II  TM Distance: >3 FB Neck ROM: Full    Dental  (+) Poor Dentition   Pulmonary neg pulmonary ROS, neg sleep apnea, neg COPD,    - rhonchi (-) wheezing      Cardiovascular Exercise Tolerance: Good (-) hypertension(-) CAD, (-) Past MI, (-) Cardiac Stents, (-) CABG and (-) CHF (-) dysrhythmias (-) Valvular Problems/Murmurs - Systolic murmurs and - Diastolic murmurs    Neuro/Psych neg Seizures negative neurological ROS  negative psych ROS   GI/Hepatic Neg liver ROS, neg GERD  ,  Endo/Other  diabetes  Renal/GU negative Renal ROS     Musculoskeletal negative musculoskeletal ROS (+)   Abdominal (+) + obese,   Peds  Hematology negative hematology ROS (+)   Anesthesia Other Findings   Reproductive/Obstetrics                             Anesthesia Physical  Anesthesia Plan  ASA: II  Anesthesia Plan: General   Post-op Pain Management:    Induction: Intravenous  PONV Risk Score and Plan: 2 and Ondansetron and Midazolam  Airway Management Planned: Oral ETT  Additional Equipment:   Intra-op Plan:   Post-operative Plan: Extubation in OR  Informed Consent: I have reviewed the patients History and Physical, chart, labs and discussed the procedure including the risks, benefits and alternatives for the proposed anesthesia with the patient or authorized representative who has indicated his/her understanding and acceptance.       Plan Discussed with:   Anesthesia Plan Comments:         Anesthesia Quick Evaluation

## 2018-11-09 NOTE — Transfer of Care (Signed)
Immediate Anesthesia Transfer of Care Note  Patient: Derek George  Procedure(s) Performed: ENDOSCOPIC RETROGRADE CHOLANGIOPANCREATOGRAPHY (ERCP) (N/A )  Patient Location: PACU  Anesthesia Type:General  Level of Consciousness: drowsy  Airway & Oxygen Therapy: Patient Spontanous Breathing and Patient connected to face mask oxygen  Post-op Assessment: Report given to RN and Post -op Vital signs reviewed and stable  Post vital signs: Reviewed and stable  Last Vitals:  Vitals Value Taken Time  BP 162/90 11/09/18 1237  Temp    Pulse 71 11/09/18 1242  Resp 19 11/09/18 1242  SpO2 97 % 11/09/18 1242  Vitals shown include unvalidated device data.  Last Pain:  Vitals:   11/09/18 1053  TempSrc: Tympanic  PainSc: 2          Complications: No apparent anesthesia complications

## 2018-11-09 NOTE — Anesthesia Procedure Notes (Signed)
Procedure Name: Intubation Date/Time: 11/09/2018 11:47 AM Performed by: Rudean Hitt, CRNA Pre-anesthesia Checklist: Patient identified, Patient being monitored, Timeout performed, Emergency Drugs available and Suction available Patient Re-evaluated:Patient Re-evaluated prior to induction Oxygen Delivery Method: Circle system utilized Preoxygenation: Pre-oxygenation with 100% oxygen Induction Type: IV induction Ventilation: Mask ventilation without difficulty Laryngoscope Size: Mac and 4 Grade View: Grade III Tube type: Oral Tube size: 7.0 mm Number of attempts: 1 Airway Equipment and Method: Stylet Placement Confirmation: ETT inserted through vocal cords under direct vision,  positive ETCO2 and breath sounds checked- equal and bilateral Secured at: 21 cm Tube secured with: Tape Dental Injury: Teeth and Oropharynx as per pre-operative assessment

## 2018-11-10 DIAGNOSIS — K8050 Calculus of bile duct without cholangitis or cholecystitis without obstruction: Secondary | ICD-10-CM

## 2018-11-10 LAB — COMPREHENSIVE METABOLIC PANEL
ALT: 305 U/L — ABNORMAL HIGH (ref 0–44)
AST: 156 U/L — ABNORMAL HIGH (ref 15–41)
Albumin: 2.9 g/dL — ABNORMAL LOW (ref 3.5–5.0)
Alkaline Phosphatase: 291 U/L — ABNORMAL HIGH (ref 38–126)
Anion gap: 11 (ref 5–15)
BUN: 19 mg/dL (ref 6–20)
CO2: 23 mmol/L (ref 22–32)
Calcium: 8.4 mg/dL — ABNORMAL LOW (ref 8.9–10.3)
Chloride: 105 mmol/L (ref 98–111)
Creatinine, Ser: 0.66 mg/dL (ref 0.61–1.24)
GFR calc Af Amer: >60 mL/min (ref >60)
GFR calc non Af Amer: >60 mL/min (ref >60)
Glucose, Bld: 163 mg/dL — ABNORMAL HIGH (ref 70–99)
Potassium: 3.8 mmol/L (ref 3.5–5.1)
Sodium: 139 mmol/L (ref 135–145)
Total Bilirubin: 1.6 mg/dL — ABNORMAL HIGH (ref 0.3–1.2)
Total Protein: 6 g/dL — ABNORMAL LOW (ref 6.5–8.1)

## 2018-11-10 LAB — CBC
HCT: 39.7 % (ref 39.0–52.0)
Hemoglobin: 13.6 g/dL (ref 13.0–17.0)
MCH: 30.2 pg (ref 26.0–34.0)
MCHC: 34.3 g/dL (ref 30.0–36.0)
MCV: 88 fL (ref 80.0–100.0)
Platelets: 240 10*3/uL (ref 150–400)
RBC: 4.51 MIL/uL (ref 4.22–5.81)
RDW: 12.9 % (ref 11.5–15.5)
WBC: 11.8 10*3/uL — ABNORMAL HIGH (ref 4.0–10.5)
nRBC: 0 % (ref 0.0–0.2)

## 2018-11-10 LAB — GLUCOSE, CAPILLARY
Glucose-Capillary: 162 mg/dL — ABNORMAL HIGH (ref 70–99)
Glucose-Capillary: 121 mg/dL — ABNORMAL HIGH (ref 70–99)

## 2018-11-10 MED ORDER — AMOXICILLIN-POT CLAVULANATE 875-125 MG PO TABS: 1.0000 | ORAL_TABLET | Freq: Two times a day (BID) | ORAL | 0 refills | Status: AC

## 2018-11-10 MED ORDER — INSULIN DETEMIR 100 UNIT/ML ~~LOC~~ SOLN: 17.0000 [IU] | Freq: Every day | SUBCUTANEOUS | 11 refills | Status: DC

## 2018-11-10 MED ORDER — IBUPROFEN 600 MG PO TABS: 600.0000 mg | ORAL_TABLET | Freq: Three times a day (TID) | ORAL | 0 refills | Status: DC | PRN

## 2018-11-10 MED ORDER — INSULIN ASPART 100 UNIT/ML ~~LOC~~ SOLN: 6.0000 [IU] | Freq: Three times a day (TID) | SUBCUTANEOUS | 11 refills | Status: DC

## 2018-11-10 MED ORDER — OXYCODONE HCL 5 MG PO TABS: 5.0000 mg | ORAL_TABLET | ORAL | 0 refills | Status: DC | PRN

## 2018-11-10 MED ORDER — SODIUM CHLORIDE 0.9 % IV SOLN: INTRAVENOUS | Status: DC | PRN

## 2018-11-10 NOTE — Discharge Summary (Signed)
Patient ID: Derek George MRN: [id number] DOB/AGE: [id number] 55 y.o.  Admit date: 11/07/2018 Discharge date: 11/10/2018   Discharge Diagnoses:  Active Problems:   Cholecystitis   Procedures:  1.  Laparoscopic cholecystectomy with intraoperative cholangiogram 2.  ERCP  Hospital Course: Patient was admitted on 11/07/2018 with acute cholecystitis and was taken to the operating room on the same day.  His LFTs were mildly elevated on admission and thus an intraoperative cholangiogram was obtained.  This showed that there were no definitive filling defects however contrast did not reach the duodenum.  Postoperatively, his LFTs were checked again and they were more elevated.  GI was consulted and ERCP was done on 9/25.  Sphincterotomy was done and sludge was swept of the biliary tree.  Following that, his LFTs have improved.  Overall the patient pain is well controlled, he is ambulating, tolerating a diet, his incisions are clean dry and intact with no evidence of infection.  His JP drain has serosanguineous fluid.  Of note his blood glucose was noted to be very high and his A1c was elevated at 12.4.  He has been started on insulin regimen he will be given prescriptions for these for outpatient.  It was discussed with the patient how important it is for him to follow-up with his primary care provider as an outpatient in order to continue his diabetes management.  The patient reports that he used to take some pills and then would only take them every so often until he ran out.  Consults: Diabetes management team, gastroenterology team  Disposition: Discharge disposition: 01-Home or Self Care       Discharge Instructions    Call MD for:  difficulty breathing, headache or visual disturbances   Complete by: As directed    Call MD for:  persistant nausea and vomiting   Complete by: As directed    Call MD for:  redness, tenderness, or signs of infection (pain, swelling, redness, odor or  green/yellow discharge around incision site)   Complete by: As directed    Call MD for:  severe uncontrolled pain   Complete by: As directed    Call MD for:  temperature >100.4   Complete by: As directed    Change dressing (specify)   Complete by: As directed    Dry gauze dressing around drain.  Change once daily and as needed to keep site clean and dry.   Diet - low sodium heart healthy   Complete by: As directed    Discharge instructions   Complete by: As directed    1.  Patient may shower, but do not scrub wounds heavily and dab dry only. 2.  Do not submerge wounds in pool/tub for 1 week. 3.  Do not apply ointments or hydrogen peroxide to the wounds. 4.  Please empty your drain twice daily and record output.  Bring record with you to your office appointment. 5.  Please take your insulin as instructed.  We understand you would prefer taking pills instead of insulin.  We will defer to your Primary Care Provider to make that decision with you and start you on the appropriate medications.   Driving Restrictions   Complete by: As directed    Do not drive while taking narcotics for pain control.   Increase activity slowly   Complete by: As directed    Lifting restrictions   Complete by: As directed    No heavy lifting or pushing of more than 10-15 lbs for 4 weeks.  Allergies as of 11/10/2018      Reactions   Metformin Shortness Of Breath   Reports liver damage Reports liver damage Reports liver damage      Medication List    TAKE these medications   amoxicillin-clavulanate 875-125 MG tablet Commonly known as: Augmentin Take 1 tablet by mouth 2 (two) times daily for 10 days.   ibuprofen 600 MG tablet Commonly known as: ADVIL Take 1 tablet (600 mg total) by mouth every 8 (eight) hours as needed for fever, mild pain or moderate pain.   insulin aspart 100 UNIT/ML injection Commonly known as: novoLOG Inject 6 Units into the skin 3 (three) times daily with meals.   insulin  detemir 100 UNIT/ML injection Commonly known as: LEVEMIR Inject 0.17 mLs (17 Units total) into the skin at bedtime.   omeprazole 20 MG capsule Commonly known as: PRILOSEC Take 20 mg by mouth daily.   oxyCODONE 5 MG immediate release tablet Commonly known as: Oxy IR/ROXICODONE Take 1 tablet (5 mg total) by mouth every 4 (four) hours as needed for severe pain.            Discharge Care Instructions  (From admission, onward)         Start     Ordered   11/10/18 0000  Change dressing (specify)    Comments: Dry gauze dressing around drain.  Change once daily and as needed to keep site clean and dry.   11/10/18 1231         Follow-up Information    Lynden Oxford R, PA-C Follow up.   Specialty: Physician Assistant Why: Follow up with Laqueta Due approximately on 11/15/18 Contact information: [address] [place] [address] [phone]

## 2018-11-10 NOTE — Progress Notes (Signed)
Crackles noted in patients lower lungs. Dr Hampton Abbot notified. Order put in to discontinue IVF

## 2018-11-10 NOTE — Progress Notes (Signed)
Patient and wife was taught how to change JP drain dressing and empty JP drain as well as how to administer insulin using a hospital interpretor for discharge.  Darnelle Catalan, RN

## 2018-11-10 NOTE — Progress Notes (Signed)
Derek George , MD [address], [place], [place], [place], [address] [address] [place], [place], [place], [address] Phone: [phone]  Fax: [phone]   Derek George is being followed for choledocholithiasis status post ERCP  Subjective: No pain doing well    Objective: Vital signs in last 24 hours: Vitals:   11/09/18 1500 11/09/18 1941 11/10/18 0025 11/10/18 0458  BP:  (!) 162/79 (!) 152/77 (!) 169/77  Pulse:  (!) 58 (!) 54 60  Resp:  20 20 20   Temp: 98 F (36.7 C) 98 F (36.7 C) 98.4 F (36.9 C) 98.8 F (37.1 C)  TempSrc: Oral Oral Oral Oral  SpO2:  98% 97% 97%  Weight:      Height:       Weight change:   Intake/Output Summary (Last 24 hours) at 11/10/2018 8841 Last data filed at 11/10/2018 0900 Gross per 24 hour  Intake 2027.84 ml  Output 1000 ml  Net 1027.84 ml     Exam: Heart:: Regular rate and rhythm, S1S2 present or without murmur or extra heart sounds Lungs: normal, clear to auscultation and clear to auscultation and percussion Abdomen: soft, nontender, normal bowel sounds   Lab Results: @LABTEST2 @ Micro Results: Recent Results (from the past 240 hour(s))  SARS Coronavirus 2 [facility] order, Performed in [facility] hospital lab) Nasopharyngeal Nasopharyngeal Swab     Status: None   Collection Time: 11/07/18  1:14 AM   Specimen: Nasopharyngeal Swab  Result Value Ref Range Status   SARS Coronavirus 2 NEGATIVE NEGATIVE Final    Comment: (NOTE) If result is NEGATIVE SARS-CoV-2 target nucleic acids are NOT DETECTED. The SARS-CoV-2 RNA is generally detectable in upper and lower  respiratory specimens during the acute phase of infection. The lowest  concentration of SARS-CoV-2 viral copies this assay can detect is 250  copies / mL. A negative result does not preclude SARS-CoV-2 infection  and should not be used as the sole basis for treatment or other  patient management decisions.  A negative result may occur with  improper specimen  collection / handling, submission of specimen other  than nasopharyngeal swab, presence of viral mutation(s) within the  areas targeted by this assay, and inadequate number of viral copies  (<250 copies / mL). A negative result must be combined with clinical  observations, patient history, and epidemiological information. If result is POSITIVE SARS-CoV-2 target nucleic acids are DETECTED. The SARS-CoV-2 RNA is generally detectable in upper and lower  respiratory specimens dur ing the acute phase of infection.  Positive  results are indicative of active infection with SARS-CoV-2.  Clinical  correlation with patient history and other diagnostic information is  necessary to determine patient infection status.  Positive results do  not rule out bacterial infection or co-infection with other viruses. If result is PRESUMPTIVE POSTIVE SARS-CoV-2 nucleic acids MAY BE PRESENT.   A presumptive positive result was obtained on the submitted specimen  and confirmed on repeat testing.  While 2019 novel coronavirus  (SARS-CoV-2) nucleic acids may be present in the submitted sample  additional confirmatory testing may be necessary for epidemiological  and / or clinical management purposes  to differentiate between  SARS-CoV-2 and other Sarbecovirus currently known to infect humans.  If clinically indicated additional testing with an alternate test  methodology [phone]) is advised. The SARS-CoV-2 RNA is generally  detectable in upper and lower respiratory sp ecimens during the acute  phase of infection. The expected result is Negative. Fact Sheet for Patients:  BoilerBrush.com.cy Fact Sheet for Healthcare Providers: https://pope.com/ This test is not yet approved or cleared by the [place] FDA and has been authorized for detection and/or diagnosis of SARS-CoV-2 by FDA under an Emergency Use Authorization (EUA).  This EUA will remain in effect  (meaning this test can be used) for the duration of the COVID-19 declaration under Section 564(b)(1) of the Act, 21 U.S.C. section 360bbb-3(b)(1), unless the authorization is terminated or revoked sooner. Performed at [facility], [address] [place], [place] [address]   Culture, blood (routine x 2)     Status: None (Preliminary result)   Collection Time: 11/08/18  2:33 PM   Specimen: BLOOD  Result Value Ref Range Status   Specimen Description BLOOD RIGHT ANTECUBITAL  Final   Special Requests   Final    Blood Culture results may not be optimal due to an inadequate volume of blood received in culture bottles   Culture   Final    NO GROWTH 2 DAYS Performed at [facility], [address] [place], [place] [address]    Report Status PENDING  Incomplete  Culture, blood (routine x 2)     Status: None (Preliminary result)   Collection Time: 11/08/18  3:39 PM   Specimen: BLOOD  Result Value Ref Range Status   Specimen Description BLOOD BLOOD RIGHT FOREARM  Final   Special Requests Blood Culture adequate volume  Final   Culture   Final    NO GROWTH 2 DAYS Performed at [facility], [address] [place], [place] [address]    Report Status PENDING  Incomplete   Studies/Results: Dg C-arm 1-60 Min-no Report  Result Date: 11/09/2018 Fluoroscopy was utilized by the requesting physician.  No radiographic interpretation.   Medications: I have reviewed the patient's current medications. Scheduled Meds: . acetaminophen  1,000 mg Oral Q6H  . heparin  5,000 Units Subcutaneous Q8H  . insulin aspart  0-20 Units Subcutaneous TID WC  . insulin aspart  0-5 Units Subcutaneous QHS  . insulin aspart  6 Units Subcutaneous TID WC  . insulin detemir  17 Units Subcutaneous QHS  . ketorolac  30 mg Intravenous Q6H  . pantoprazole (PROTONIX) IV  40 mg Intravenous QHS   Continuous Infusions: . sodium chloride    . piperacillin-tazobactam (ZOSYN)  IV 3.375 g  (11/10/18 0801)  . sodium chloride     PRN Meds:.sodium chloride, diphenhydrAMINE **OR** diphenhydrAMINE, hydrALAZINE, morphine injection, ondansetron **OR** ondansetron (ZOFRAN) IV, oxyCODONE, phenol, prochlorperazine **OR** prochlorperazine   Assessment: Active Problems:   Cholecystitis  Derek George 53 y.o. male admitted with normal LFTs on 11/07/2018.  Total bilirubin of 1.4.  Status post laparoscopic cholecystectomy.  Intraoperative cholangiogram revealed that there was a blockage in the common bile duct.  Status post ERCP 11/09/2018 and sludge was swept out of the common bile duct.  Today bilirubin is down from 4.4-1.6.  AST is down to 156 from 302 white cell count is down to 11.8 from 24 Doing well no pain .    Plan: 1. Advance diet as tolerated   I will sign off.  Please call me if any further GI concerns or questions.  We would like to thank you for the opportunity to participate in the care of [facility].    LOS: 3 days   Wyline Mood, MD 11/10/2018, 9:37 AM

## 2018-11-12 ENCOUNTER — Encounter: Payer: Self-pay | Admitting: Gastroenterology

## 2018-11-13 LAB — CULTURE, BLOOD (ROUTINE X 2)
Culture: NO GROWTH
Culture: NO GROWTH
Special Requests: ADEQUATE

## 2018-11-15 ENCOUNTER — Ambulatory Visit (INDEPENDENT_AMBULATORY_CARE_PROVIDER_SITE_OTHER): Payer: Self-pay | Admitting: Physician Assistant

## 2018-11-15 ENCOUNTER — Other Ambulatory Visit: Payer: Self-pay

## 2018-11-15 ENCOUNTER — Encounter: Payer: Self-pay | Admitting: Physician Assistant

## 2018-11-15 VITALS — BP 168/84 | HR 67 | Temp 97.7°F | Resp 14 | Ht 65.0 in | Wt 180.4 lb

## 2018-11-15 DIAGNOSIS — K819 Cholecystitis, unspecified: Secondary | ICD-10-CM

## 2018-11-15 DIAGNOSIS — Z09 Encounter for follow-up examination after completed treatment for conditions other than malignant neoplasm: Secondary | ICD-10-CM

## 2018-11-15 NOTE — Progress Notes (Signed)
[facility] POST-OP OFFICE VISIT  11/15/2018  HPI: Derek George is a 53 y.o. male 8 days s/p laparoscopic cholecystectomy with Dr Dahlia Byes. IOC showed retained CBD stone and underwent ERCP on 09/25 with Dr Allen Norris, MD.   Today, he reports that his pain is improved. He is taking some of the pain medications but weaning from them. No fever, chills, nausea, or emesis. JP with serosanguinous output and less 20 ccs a day. He does have some constipation. No other issues.   Vital signs: BP (!) 168/84   Pulse 67   Temp 97.7 F (36.5 C)   Resp 14   Ht 5\' 5"  (1.651 m)   Wt 180 lb 6.4 oz (81.8 kg)   SpO2 97%   BMI 30.02 kg/m    Physical Exam: Constitutional: Well appearing male, NAD Abdomen: Soft, non-tender, non-distended. No rebound/guarding. JP in RUQ with minimal serosanguinous output Skin: Laparoscopic incisions are CDI with dermabond, no erythema or drainage  Assessment/Plan: This is a 53 y.o. male overall doing well 8 days s/p  laparoscopic cholecystectomy and subsequent IOC   - pain control prn  - miralax for constipation  - complete 4 weeks lifting restrictions; letter for work provided  - drain removed  - pathology reviewed  - f/u in 2 weeks for recheck   -- Edison Simon, PA-C [facility] 11/15/2018, 9:27 AM [phone] M-F: 7am - 4pm

## 2018-11-15 NOTE — Patient Instructions (Signed)
May take Colace for constipation daily.

## 2018-11-29 ENCOUNTER — Ambulatory Visit (INDEPENDENT_AMBULATORY_CARE_PROVIDER_SITE_OTHER): Payer: Self-pay | Admitting: Physician Assistant

## 2018-11-29 ENCOUNTER — Encounter: Payer: Self-pay | Admitting: Physician Assistant

## 2018-11-29 ENCOUNTER — Other Ambulatory Visit: Payer: Self-pay

## 2018-11-29 VITALS — BP 164/93 | HR 83 | Temp 97.5°F | Ht 65.0 in | Wt 185.4 lb

## 2018-11-29 DIAGNOSIS — Z09 Encounter for follow-up examination after completed treatment for conditions other than malignant neoplasm: Secondary | ICD-10-CM

## 2018-11-29 DIAGNOSIS — K819 Cholecystitis, unspecified: Secondary | ICD-10-CM

## 2018-11-29 NOTE — Progress Notes (Signed)
[facility] POST-OP OFFICE VISIT  11/29/2018  HPI: Derek George is a 53 y.o. male 3 weeks s/p laparoscopic cholecystectomy with Dr Dahlia Byes.   He is doing well.  No pain, nausea, emesis Tolerating diet Normal BMs  Vital signs: BP (!) 164/93   Pulse 83   Temp (!) 97.5 F (36.4 C) (Temporal)   Ht 5\' 5"  (1.651 m)   Wt 185 lb 6.4 oz (84.1 kg)   SpO2 97%   BMI 30.85 kg/m    Physical Exam: Constitutional: well appearing male, NAD Abdomen: soft, non-tender, non-distended, no rebound/guarding Skin: Laparoscopic incisions are CDI, no erythema or drainage.   Assessment/Plan: This is a 53 y.o. male  3 weeks s/p laparoscopic cholecystectomy   - pain control prn  - okay to submerge wounds  - complete 4 weeks lifting restrictions  - rtc prn  -- Edison Simon, PA-C [facility] 11/29/2018, 9:22 AM [phone] M-F: 7am - 4pm

## 2018-11-29 NOTE — Patient Instructions (Signed)
One more week of no heavy lifting then you can resume normal activities.

## 2020-05-09 IMAGING — US US ABDOMEN LIMITED
1 series · 14 of 25 positions shown · non-contrast
Comparison: None.

CLINICAL DATA: Upper abdominal pain radiating to the back since
yesterday.

EXAM:
ULTRASOUND ABDOMEN LIMITED RIGHT UPPER QUADRANT

[Series 1: us abdomen limited · 0.30mm/px · 14 of 43 slices shown]
[im 1/43]
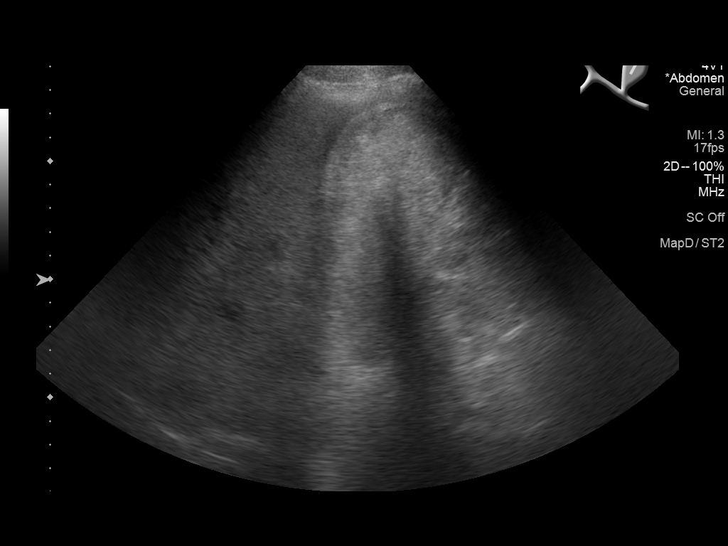
[im 4/43]
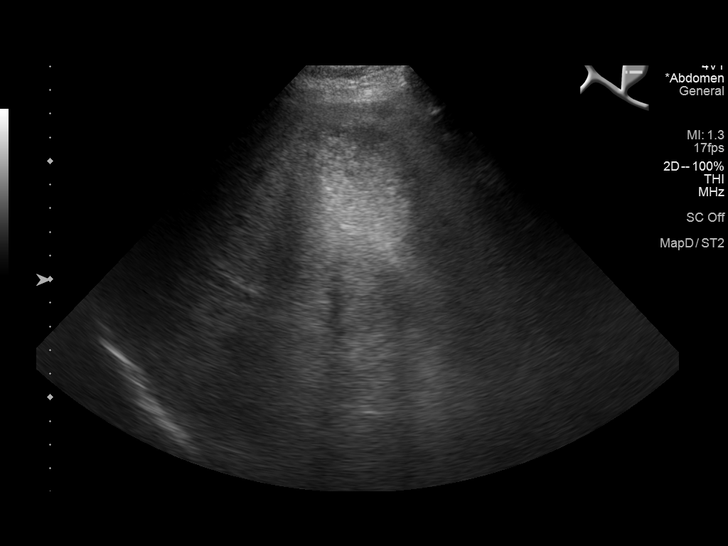
[im 8/43]
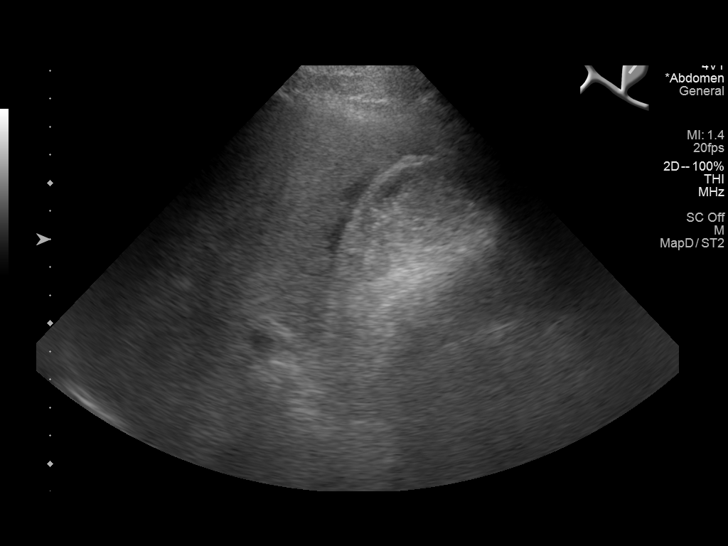
[im 11/43]
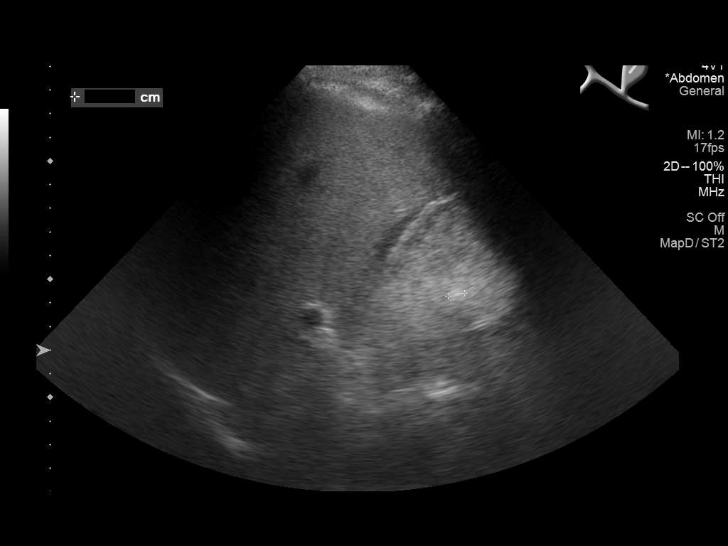
[im 15/43]
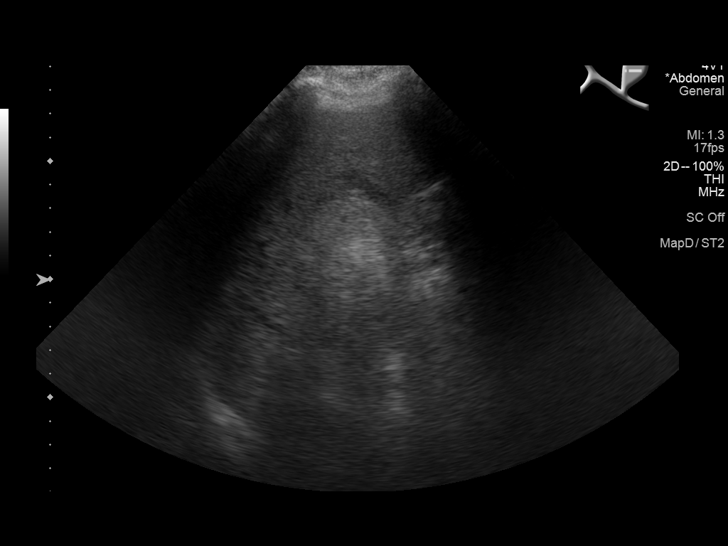
[im 16/43]
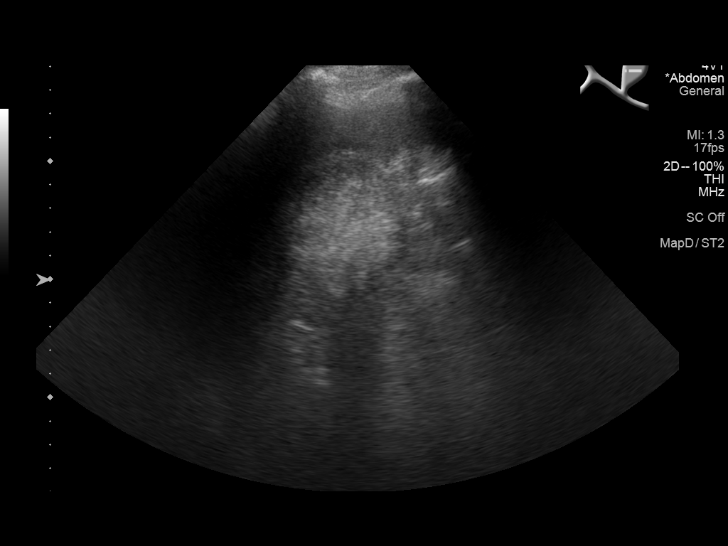
[im 20/43]
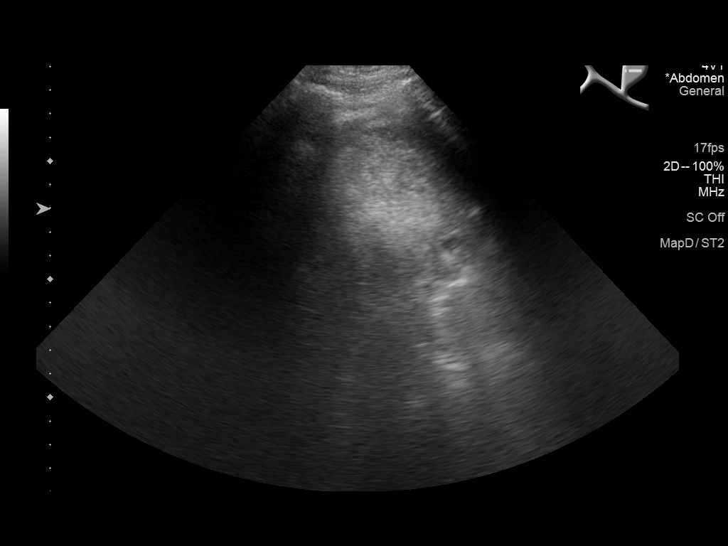
[im 23/43]
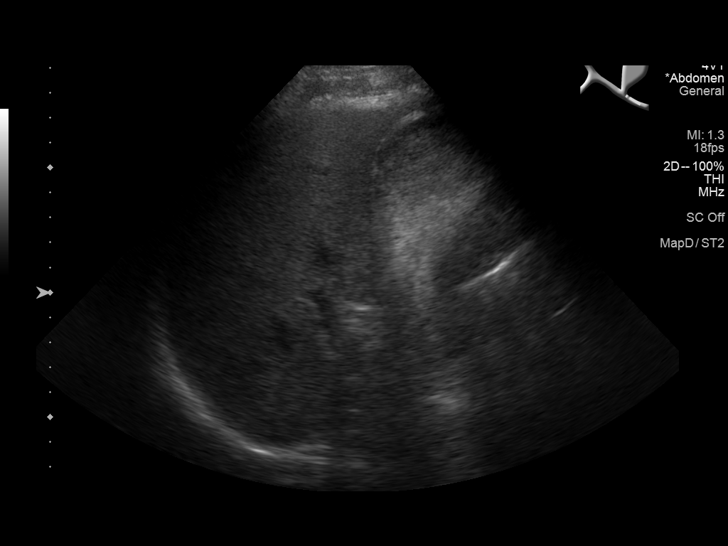
[im 27/43]
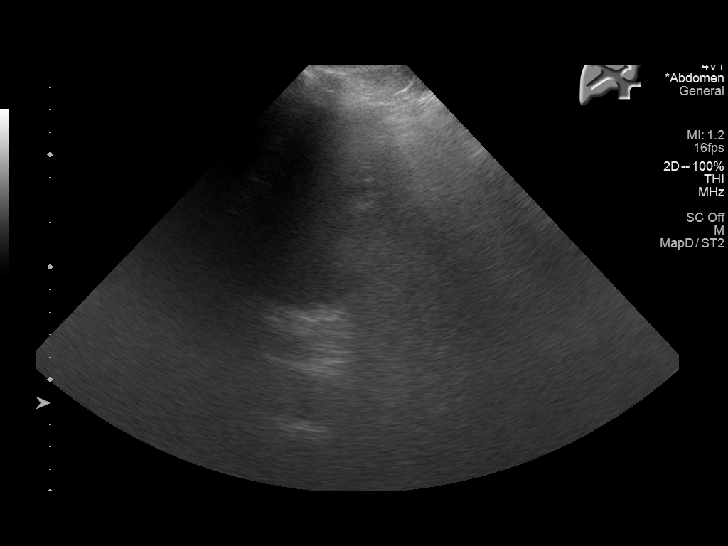
[im 29/43]
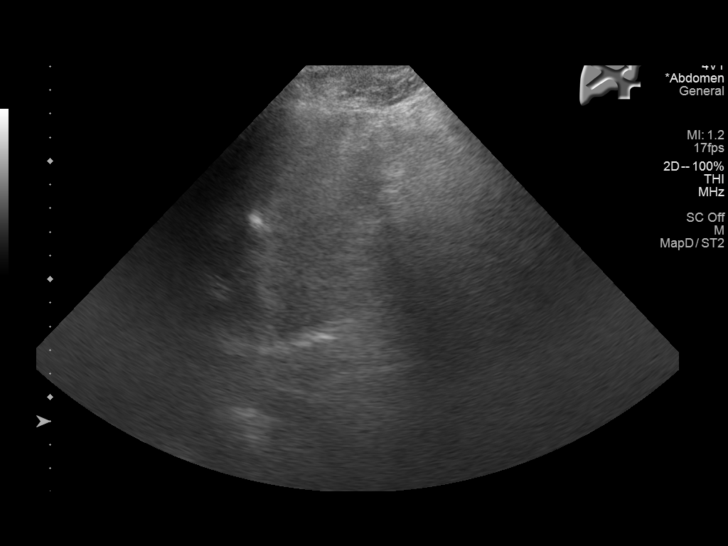
[im 32/43]
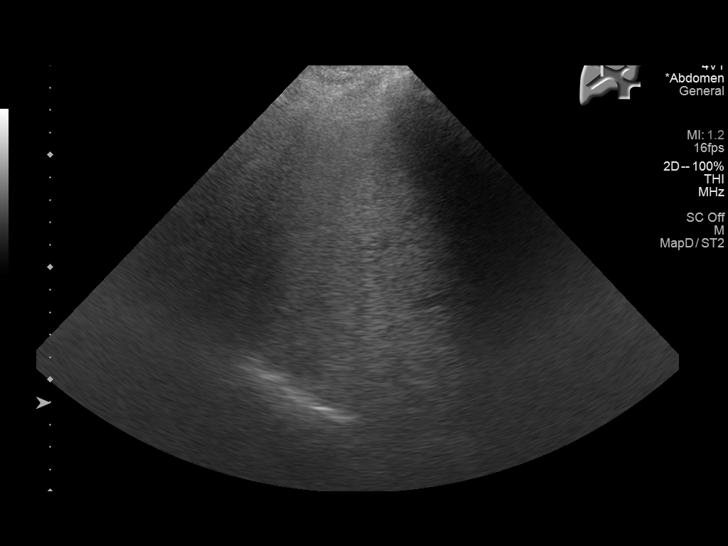
[im 36/43]
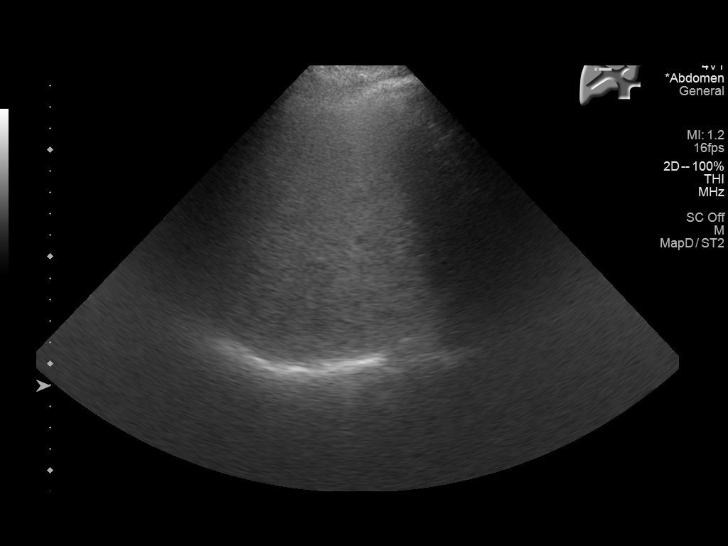
[im 39/43]
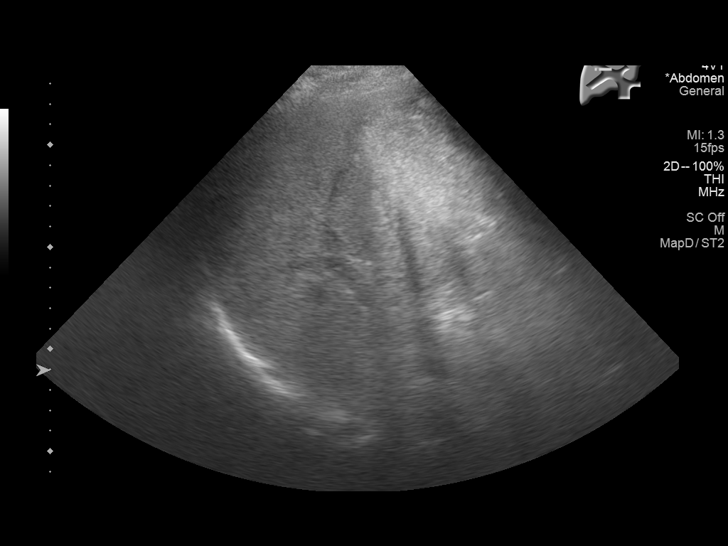
[im 43/43]
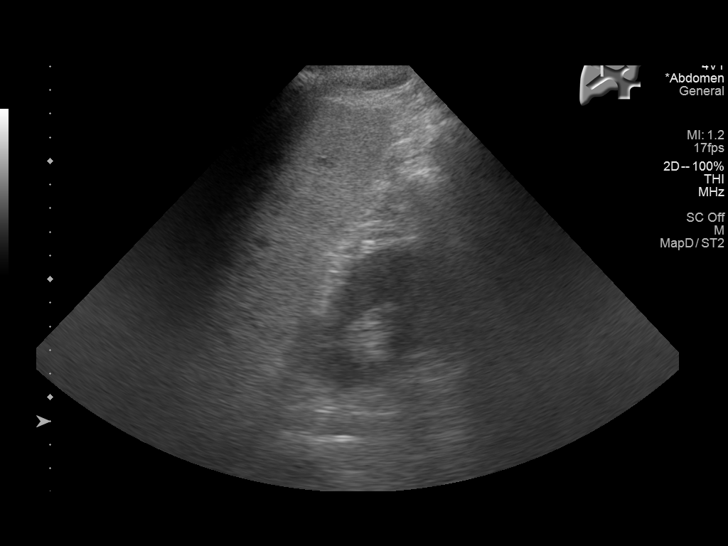

[14 of 25 positions shown; findings below may reference images not displayed]

FINDINGS: Gallbladder:

Distended containing hyperechoic intraluminal contents, likely
combination of sludge and stones. Intraluminal contents lead to
posterior shadowing partially obscuring evaluation of the posterior
wall. There is gallbladder wall thickening at 6 mm. Small amount of
pericholecystic fluid. Positive sonographic Murphy sign noted by
sonographer.

Common bile duct:

Diameter: 3 mm.

Liver:

No focal lesion identified. Heterogeneously increased in parenchymal
echogenicity. Liver parenchyma is difficult to penetrate. Portal
vein is patent on color Doppler imaging with normal direction of
blood flow towards the liver.

Other: None.
IMPRESSION: 1. Findings consistent with acute cholecystitis. Distended
gallbladder filled with sludge and stones with wall thickening,
pericholecystic fluid, and positive sonographic Murphy sign.
2. No biliary dilatation.
3. Hepatic steatosis.

## 2021-11-10 NOTE — Progress Notes (Signed)
"   Derek George is a 56 y.o. male w/ pmhx of DM, HTN, and HLD presenting for cataract evaluation  # T2DM with Moderate NPDR both eyes, CME right eye  - follows with [facility] - last avastin injection in 2021 per notes   # Cataracts, OU -- Visually significant -- Difficulty with: progressively worsening vision that significantly impairs meeting vocational or recreational expectations -- Coexisting ocular disease: moderate NPDR (follows with [facility]) -- Lens:  - OD: 1+ NS with 2+ CS 2+ PSC   - OS: 1+ NS with 2+ CS 2+ PSC -- Pupil dilation:  - OD: 7 mm  - OS: 7 mm -- Pseudoexfoliation?: absent -- FECD?: absent -- H/o Trauma?: absent -- Phacodenesis?: absent -- Flomax Use?: absent -- Allergies to iodine or latex: no -- CL use?: absent -- Prior refractive surgery?: no -- BCVA?: OD: 20/80  OS: 20/80  Keratometry     Keratometry       K1 Axis K2 Axis   Right 42.50 180 42.50 090   Left 42.50 040 43.00 130           Not recorded    Manifest Refraction     Manifest Refraction       Sphere Cylinder Axis Dist VA   Right -4.25 +2.50 040 20/80+2   Left -4.00 +2.00 160 20/80+1            Plan: - approved for surgery both eyes. Needs FAP. Instructed to call back once FAP is approved.  - CE/IOL eye order by operating surgeon - Will follow up with [facility] in regards to retina care   RTC Post-op day 1, sooner PRN  - V/T  Patient was seen with Dr. Signe Isaiah Ripper, MD  [facility] Ophthalmology Department, PGY-3   "

## 2024-02-05 ENCOUNTER — Observation Stay: Payer: Self-pay

## 2024-02-05 ENCOUNTER — Emergency Department: Payer: Self-pay

## 2024-02-05 ENCOUNTER — Other Ambulatory Visit: Payer: Self-pay

## 2024-02-05 ENCOUNTER — Inpatient Hospital Stay
Admission: EM | Admit: 2024-02-05 | Discharge: 2024-02-07 | Disposition: A | Discharge status: Home / Self Care | Payer: Self-pay | Attending: Internal Medicine | Admitting: Internal Medicine

## 2024-02-05 DIAGNOSIS — G510 Bell's palsy: Secondary | ICD-10-CM | POA: Diagnosis present

## 2024-02-05 DIAGNOSIS — R531 Weakness: Secondary | ICD-10-CM

## 2024-02-05 DIAGNOSIS — Z713 Dietary counseling and surveillance: Secondary | ICD-10-CM

## 2024-02-05 DIAGNOSIS — E1165 Type 2 diabetes mellitus with hyperglycemia: Secondary | ICD-10-CM | POA: Diagnosis present

## 2024-02-05 DIAGNOSIS — G8194 Hemiplegia, unspecified affecting left nondominant side: Secondary | ICD-10-CM | POA: Diagnosis present

## 2024-02-05 DIAGNOSIS — R4701 Aphasia: Principal | ICD-10-CM | POA: Diagnosis present

## 2024-02-05 DIAGNOSIS — E119 Type 2 diabetes mellitus without complications: Secondary | ICD-10-CM

## 2024-02-05 DIAGNOSIS — Z7984 Long term (current) use of oral hypoglycemic drugs: Secondary | ICD-10-CM

## 2024-02-05 DIAGNOSIS — I639 Cerebral infarction, unspecified: Secondary | ICD-10-CM

## 2024-02-05 DIAGNOSIS — R29703 NIHSS score 3: Secondary | ICD-10-CM | POA: Diagnosis present

## 2024-02-05 DIAGNOSIS — Z9049 Acquired absence of other specified parts of digestive tract: Secondary | ICD-10-CM

## 2024-02-05 DIAGNOSIS — R079 Chest pain, unspecified: Secondary | ICD-10-CM | POA: Diagnosis not present

## 2024-02-05 DIAGNOSIS — I10 Essential (primary) hypertension: Secondary | ICD-10-CM | POA: Diagnosis present

## 2024-02-05 DIAGNOSIS — Z5971 Insufficient health insurance coverage: Secondary | ICD-10-CM

## 2024-02-05 DIAGNOSIS — I6329 Cerebral infarction due to unspecified occlusion or stenosis of other precerebral arteries: Principal | ICD-10-CM | POA: Diagnosis present

## 2024-02-05 DIAGNOSIS — E669 Obesity, unspecified: Secondary | ICD-10-CM | POA: Diagnosis present

## 2024-02-05 DIAGNOSIS — E1151 Type 2 diabetes mellitus with diabetic peripheral angiopathy without gangrene: Secondary | ICD-10-CM

## 2024-02-05 DIAGNOSIS — R471 Dysarthria and anarthria: Secondary | ICD-10-CM | POA: Diagnosis present

## 2024-02-05 DIAGNOSIS — R200 Anesthesia of skin: Secondary | ICD-10-CM

## 2024-02-05 DIAGNOSIS — Z888 Allergy status to other drugs, medicaments and biological substances status: Secondary | ICD-10-CM

## 2024-02-05 DIAGNOSIS — Z6831 Body mass index (BMI) 31.0-31.9, adult: Secondary | ICD-10-CM

## 2024-02-05 LAB — CBC
HCT: 41.5 % (ref 39.0–52.0)
HCT: 42.6 % (ref 39.0–52.0)
Hemoglobin: 14.2 g/dL (ref 13.0–17.0)
Hemoglobin: 14.8 g/dL (ref 13.0–17.0)
MCH: 30.5 pg (ref 26.0–34.0)
MCH: 30.3 pg (ref 26.0–34.0)
MCHC: 34.2 g/dL (ref 30.0–36.0)
MCHC: 34.7 g/dL (ref 30.0–36.0)
MCV: 89.1 fL (ref 80.0–100.0)
MCV: 87.3 fL (ref 80.0–100.0)
Platelets: 248 K/uL (ref 150–400)
Platelets: 251 K/uL (ref 150–400)
RBC: 4.66 MIL/uL (ref 4.22–5.81)
RBC: 4.88 MIL/uL (ref 4.22–5.81)
RDW: 13.2 % (ref 11.5–15.5)
RDW: 13.2 % (ref 11.5–15.5)
WBC: 8.5 K/uL (ref 4.0–10.5)
WBC: 10.5 K/uL (ref 4.0–10.5)
nRBC: 0 % (ref 0.0–0.2)
nRBC: 0 % (ref 0.0–0.2)

## 2024-02-05 LAB — CBG MONITORING, ED
Glucose-Capillary: 312 mg/dL — ABNORMAL HIGH (ref 70–99)
Glucose-Capillary: 257 mg/dL — ABNORMAL HIGH (ref 70–99)

## 2024-02-05 LAB — PROTIME-INR
INR: 0.9 (ref 0.8–1.2)
Prothrombin Time: 12.7 s (ref 11.4–15.2)

## 2024-02-05 LAB — COMPREHENSIVE METABOLIC PANEL WITH GFR
ALT: 25 U/L (ref 0–44)
AST: 25 U/L (ref 15–41)
Albumin: 4 g/dL (ref 3.5–5.0)
Alkaline Phosphatase: 100 U/L (ref 38–126)
Anion gap: 9 (ref 5–15)
BUN: 18 mg/dL (ref 6–20)
CO2: 26 mmol/L (ref 22–32)
Calcium: 9.1 mg/dL (ref 8.9–10.3)
Chloride: 100 mmol/L (ref 98–111)
Creatinine, Ser: 1.01 mg/dL (ref 0.61–1.24)
GFR, Estimated: >60 mL/min
Glucose, Bld: 339 mg/dL — ABNORMAL HIGH (ref 70–99)
Potassium: 4.3 mmol/L (ref 3.5–5.1)
Sodium: 135 mmol/L (ref 135–145)
Total Bilirubin: 0.2 mg/dL (ref 0.0–1.2)
Total Protein: 7.1 g/dL (ref 6.5–8.1)

## 2024-02-05 LAB — DIFFERENTIAL
Abs Immature Granulocytes: 0.04 K/uL (ref 0.00–0.07)
Basophils Absolute: 0.1 K/uL (ref 0.0–0.1)
Basophils Relative: 1 %
Eosinophils Absolute: 0.1 K/uL (ref 0.0–0.5)
Eosinophils Relative: 1 %
Immature Granulocytes: 1 %
Lymphocytes Relative: 15 %
Lymphs Abs: 1.3 K/uL (ref 0.7–4.0)
Monocytes Absolute: 0.4 K/uL (ref 0.1–1.0)
Monocytes Relative: 5 %
Neutro Abs: 6.6 K/uL (ref 1.7–7.7)
Neutrophils Relative %: 77 %

## 2024-02-05 LAB — TROPONIN T, HIGH SENSITIVITY
Troponin T High Sensitivity: 29 ng/L — ABNORMAL HIGH (ref 0–19)
Troponin T High Sensitivity: 25 ng/L — ABNORMAL HIGH (ref 0–19)

## 2024-02-05 LAB — HIV ANTIBODY (ROUTINE TESTING W REFLEX): HIV Screen 4th Generation wRfx: NONREACTIVE

## 2024-02-05 LAB — ETHANOL: Alcohol, Ethyl (B): <15 mg/dL

## 2024-02-05 LAB — GLUCOSE, CAPILLARY
Glucose-Capillary: 194 mg/dL — ABNORMAL HIGH (ref 70–99)
Glucose-Capillary: 140 mg/dL — ABNORMAL HIGH (ref 70–99)
Glucose-Capillary: 129 mg/dL — ABNORMAL HIGH (ref 70–99)

## 2024-02-05 LAB — CREATININE, SERUM
Creatinine, Ser: 0.9 mg/dL (ref 0.61–1.24)
GFR, Estimated: >60 mL/min

## 2024-02-05 LAB — HEMOGLOBIN A1C
Hgb A1c MFr Bld: 8.8 % — ABNORMAL HIGH (ref 4.8–5.6)
Mean Plasma Glucose: 205.86 mg/dL

## 2024-02-05 LAB — APTT: aPTT: 29 s (ref 24–36)

## 2024-02-05 MED ORDER — LABETALOL HCL 5 MG/ML IV SOLN
INTRAVENOUS | Status: AC
  Filled 2024-02-05: qty 4

## 2024-02-05 MED ORDER — ASPIRIN 81 MG PO CHEW
324.0000 mg | CHEWABLE_TABLET | Freq: Once | ORAL | Status: AC
  Administered 2024-02-05: 324 mg via ORAL
  Filled 2024-02-05: qty 4

## 2024-02-05 MED ORDER — HYDRALAZINE HCL 20 MG/ML IJ SOLN
10.0000 mg | Freq: Four times a day (QID) | INTRAMUSCULAR | Status: DC | PRN
  Administered 2024-02-06: 10 mg via INTRAVENOUS
  Filled 2024-02-05: qty 1

## 2024-02-05 MED ORDER — STROKE: EARLY STAGES OF RECOVERY BOOK: Freq: Once | Status: AC

## 2024-02-05 MED ORDER — ENOXAPARIN SODIUM 40 MG/0.4ML IJ SOSY
40.0000 mg | PREFILLED_SYRINGE | INTRAMUSCULAR | Status: DC
  Administered 2024-02-05 – 2024-02-06 (×2): 40 mg via SUBCUTANEOUS
  Filled 2024-02-05 (×2): qty 0.4

## 2024-02-05 MED ORDER — SODIUM CHLORIDE 0.9% FLUSH: 3.0000 mL | Freq: Once | INTRAVENOUS | Status: DC

## 2024-02-05 MED ORDER — SODIUM CHLORIDE 0.9 % IV SOLN: INTRAVENOUS | Status: DC

## 2024-02-05 MED ORDER — INSULIN ASPART 100 UNIT/ML IJ SOLN
0.0000 [IU] | Freq: Three times a day (TID) | INTRAMUSCULAR | Status: DC
  Administered 2024-02-05: 1 [IU] via SUBCUTANEOUS
  Administered 2024-02-05: 5 [IU] via SUBCUTANEOUS
  Administered 2024-02-05 – 2024-02-06 (×3): 2 [IU] via SUBCUTANEOUS
  Administered 2024-02-06: 3 [IU] via SUBCUTANEOUS
  Administered 2024-02-07: 1 [IU] via SUBCUTANEOUS
  Filled 2024-02-05: qty 2
  Filled 2024-02-05 (×3): qty 1
  Filled 2024-02-05: qty 3
  Filled 2024-02-05 (×2): qty 2

## 2024-02-05 MED ORDER — ACETAMINOPHEN 325 MG PO TABS
650.0000 mg | ORAL_TABLET | ORAL | Status: DC | PRN
  Administered 2024-02-05: 650 mg via ORAL
  Filled 2024-02-05: qty 2

## 2024-02-05 MED ORDER — ACETAMINOPHEN 160 MG/5ML PO SOLN: 650.0000 mg | ORAL | Status: DC | PRN

## 2024-02-05 MED ORDER — ACETAMINOPHEN 650 MG RE SUPP: 650.0000 mg | RECTAL | Status: DC | PRN

## 2024-02-05 MED ORDER — NITROGLYCERIN 0.4 MG SL SUBL
SUBLINGUAL_TABLET | SUBLINGUAL | Status: AC
  Filled 2024-02-05: qty 1

## 2024-02-05 MED ORDER — ASPIRIN 81 MG PO TBEC
81.0000 mg | DELAYED_RELEASE_TABLET | Freq: Every day | ORAL | Status: DC
  Administered 2024-02-06 – 2024-02-07 (×2): 81 mg via ORAL
  Filled 2024-02-05 (×2): qty 1

## 2024-02-05 MED ORDER — CLOPIDOGREL BISULFATE 75 MG PO TABS
75.0000 mg | ORAL_TABLET | Freq: Every day | ORAL | Status: DC
  Administered 2024-02-05 – 2024-02-07 (×3): 75 mg via ORAL
  Filled 2024-02-05 (×3): qty 1

## 2024-02-05 MED ORDER — SENNOSIDES-DOCUSATE SODIUM 8.6-50 MG PO TABS: 1.0000 | ORAL_TABLET | Freq: Every evening | ORAL | Status: DC | PRN

## 2024-02-05 MED ORDER — ROSUVASTATIN CALCIUM 20 MG PO TABS
20.0000 mg | ORAL_TABLET | Freq: Every day | ORAL | Status: DC
  Administered 2024-02-05: 20 mg via ORAL
  Filled 2024-02-05: qty 1

## 2024-02-05 MED ORDER — ASPIRIN 81 MG PO TBEC: 81.0000 mg | DELAYED_RELEASE_TABLET | Freq: Every day | ORAL | Status: DC

## 2024-02-05 MED ORDER — INSULIN ASPART 100 UNIT/ML IJ SOLN: 0.0000 [IU] | Freq: Every day | INTRAMUSCULAR | Status: DC

## 2024-02-05 MED ORDER — NITROGLYCERIN 0.4 MG SL SUBL
0.4000 mg | SUBLINGUAL_TABLET | SUBLINGUAL | Status: DC | PRN
  Administered 2024-02-05 (×2): 0.4 mg via SUBLINGUAL
  Filled 2024-02-05 (×2): qty 1

## 2024-02-05 MED ORDER — IOHEXOL 350 MG/ML SOLN
125.0000 mL | Freq: Once | INTRAVENOUS | Status: AC | PRN
  Administered 2024-02-05: 135 mL via INTRAVENOUS

## 2024-02-05 MED ORDER — LINAGLIPTIN 5 MG PO TABS
5.0000 mg | ORAL_TABLET | Freq: Every day | ORAL | Status: DC
  Administered 2024-02-06 – 2024-02-07 (×2): 5 mg via ORAL
  Filled 2024-02-05 (×2): qty 1

## 2024-02-05 MED ADMIN — Iohexol IV Soln 350 MG/ML: 75 mL | INTRAVENOUS | NDC 00407141490

## 2024-02-05 NOTE — ED Provider Notes (Signed)
 "  [facility] Provider Note    Event Date/Time   First MD Initiated Contact with Patient 02/05/24 [phone]     (approximate)   History   Chief Complaint Code Stroke   HPI  Derek George is a 58 y.o. male with past medical history of diabetes who presents to the ED complaining of numbness.  History is limited as patient is Spanish-speaking only and majority of history is obtained with assistance of his son and brother.  They state that patient went to bed last night around 7 PM but then woke up around 3 AM complaining of shortness of breath.  He then went to the bathroom about an hour later, when he developed left-sided facial numbness and difficulty speaking.  His brother was called to the house, at which point they decided to bring him to the ED for evaluation.  Patient currently denies any difficulty breathing.     Physical Exam   Triage Vital Signs: ED Triage Vitals  Encounter Vitals Group     BP      Girls Systolic BP Percentile      Girls Diastolic BP Percentile      Boys Systolic BP Percentile      Boys Diastolic BP Percentile      Pulse      Resp      Temp      Temp src      SpO2      Weight      Height      Head Circumference      Peak Flow      Pain Score      Pain Loc      Pain Education      Exclude from Growth Chart     Most recent vital signs: Vitals:   02/05/24 0645 02/05/24 0659  BP: (!) 173/77 (!) 184/91  Pulse: 83 84  Resp: 18   Temp: 98.4 F (36.9 C) 98.4 F (36.9 C)  SpO2: 98% 98%    Constitutional: Alert and oriented. Eyes: Conjunctivae are normal. Head: Atraumatic. Nose: No congestion/rhinnorhea. Mouth/Throat: Mucous membranes are moist.  Cardiovascular: Normal rate, regular rhythm. Grossly normal heart sounds.  2+ radial pulses bilaterally. Respiratory: Normal respiratory effort.  No retractions. Lungs CTAB. Gastrointestinal: Soft and nontender. No distention. Musculoskeletal: No lower extremity tenderness  nor edema.  Neurologic: Word finding difficulties noted. No gross focal neurologic deficits are appreciated.    ED Results / Procedures / Treatments   Labs (all labs ordered are listed, but only abnormal results are displayed) Labs Reviewed  COMPREHENSIVE METABOLIC PANEL WITH GFR - Abnormal; Notable for the following components:      Result Value   Glucose, Bld 339 (*)    All other components within normal limits  CBG MONITORING, ED - Abnormal; Notable for the following components:   Glucose-Capillary 312 (*)    All other components within normal limits  TROPONIN T, HIGH SENSITIVITY - Abnormal; Notable for the following components:   Troponin T High Sensitivity 29 (*)    All other components within normal limits  PROTIME-INR  APTT  CBC  DIFFERENTIAL  ETHANOL     EKG  ED ECG REPORT I, Carlin Palin, the attending physician, personally viewed and interpreted this ECG.   Date: 02/05/2024  EKG Time: 7:08  Rate: 85  Rhythm: normal sinus rhythm  Axis: Normal  Intervals:none  ST&T Change: None  RADIOLOGY Chest x-ray reviewed and interpreted by me with no infiltrate,  edema, or effusion.  PROCEDURES:  Critical Care performed: Yes, see critical care procedure note(s)  Procedures   MEDICATIONS ORDERED IN ED: Medications  sodium chloride  flush (NS) 0.9 % injection 3 mL (has no administration in time range)  aspirin  chewable tablet 324 mg (has no administration in time range)  labetalol  (NORMODYNE ) 5 MG/ML injection (  Given 02/05/24 0636)  iohexol  (OMNIPAQUE ) 350 MG/ML injection 125 mL (135 mLs Intravenous Contrast Given 02/05/24 0655)     IMPRESSION / MDM / ASSESSMENT AND PLAN / ED COURSE  I reviewed the triage vital signs and the nursing notes.                              58 y.o. male with past medical history of diabetes who presents to the ED complaining of left-sided facial numbness and difficulty speaking since around 4 AM this morning.  Patient's  presentation is most consistent with acute presentation with potential threat to life or bodily function.  Differential diagnosis includes, but is not limited to, stroke, TIA, hypoglycemia, anemia, electrolyte abnormality, AKI, aortic dissection, ACS, PE, pneumonia, pneumothorax.  Patient nontoxic-appearing and in no acute distress, vital signs remarkable for significant hypertension but otherwise reassuring.  Code stroke activated on arrival, however in further discussion with family it was established that his last known well time was when he went to bed at 7 PM last night.  He was therefore not a candidate for TNK, CTA performed and negative for large vessel occlusion.  After returning back to the room from imaging, patient began complaining of some chest discomfort.  EKG shows no evidence of arrhythmia or ischemia, initial troponin mildly elevated.  He was evaluated by teleneurology, who recommends admission for further stroke workup.  CTA imaging was able to visualize his aortic arch with no evidence of dissection noted.  Patient given loading dose of aspirin  and case discussed with hospitalist for admission.      FINAL CLINICAL IMPRESSION(S) / ED DIAGNOSES   Final diagnoses:  Aphasia  Facial numbness  Chest pain, unspecified type     Rx / DC Orders   ED Discharge Orders     None        Note:  This document was prepared using Dragon voice recognition software and may include unintentional dictation errors.   Willo Dunnings, MD 02/05/24 [phone]  "

## 2024-02-05 NOTE — Progress Notes (Signed)
 OT Cancellation Note  Patient Details Name: Derek George MRN: [id number] DOB: [birth year]   Cancelled Treatment:    Reason Eval/Treat Not Completed: Patient at procedure or test/ unavailable. OT order received. Chart reviewed. Pt currently OTF for MRI. Will hold and re-attempt as available and pt medically appropriate for OT services.   Jhonny Pelton, M.S., OTR/L 02/05/2024, 1:38 PM

## 2024-02-05 NOTE — ED Notes (Signed)
 Primary Rn patty remains with pt in CT. This RN brought tele neurology cart to room 16 for further information from family.

## 2024-02-05 NOTE — Consult Note (Signed)
 [facility] Tele[facility]urology Consult Services   Patient Name:   Derek George, Derek George Date of Birth:   September 23, 1965 Identification Number:   MRN - [id number] Date of Service:   02/05/2024 06:25:08  Diagnosis:       R44.8 - Other and unspecified symptoms and signs involving general sensations and perceptions  Impression:      58 yo M who presents with change in speech and left sided numbness. CT head personally reviewed and does not show hemorrhage or definitive acute developing ischemic stroke. Outside window for thrombolytics. CTA head/neck and CTP without LVO or perfusion deficit noted. Differential for symptoms include acute ischemic stroke and hypertensive urgency/emergency as highest possibilities. Recommend admission for further workup and MRI brain to help differentiate between stroke and hypertensive emergency better. Full recommendations as follows:  Our recommendations are outlined below.  Recommendations:        Stroke/Telemetry Floor       Neuro Checks (Q4)       Bedside Swallow Eval       -will defer chest pain (and SOB) workup to ED/primary team       -frequent neuro checks/vitals during admission       -Goal blood pressure less than 220 systolic for now        --would treat for at least <180 systolic if MRI brain is negative for stroke       -Euglycemia and Avoid Hyperthermia (PRN Acetaminophen )       -NPO with maintenance IVFs (including no po medications) until bedside swallow screening performed (speech/swallow therapy if pt fails and also keep pt NPO)       -obtain MRI brain w/o contrast (routine)       -obtain Lipid panel, Hb A1c, TTE with bubble study       -Place on telemetry       -Bolus with Clopidogrel  300 mg bolus x1 and initiate dual antiplatelet therapy with Aspirin  81 mg daily and Clopidogrel  75 mg daily       -Start atorvastatin 40 mg po qhs       -dvt ppx per primary team       -PT/OT/ST consults - inpatient rehab if recommended        -Please notify neurology immediately for change in exam  Sign Out:       Discussed with Emergency Department Provider    ------------------------------------------------------------------------------  Advanced Imaging: CTA Head and Neck Completed.  CTP Completed.  LVO:No  Patient is not a candidate for NIR   Metrics: Last Known Well: 02/04/2024 18:00:00 Arrival Time: 02/05/2024 06:18:00 Activation Time: 02/05/2024 06:25:08 Initial Response Time: 02/05/2024 06:26:00 Symptoms: change in speech. Initial patient interaction: 02/05/2024 06:30:00 NIHSS Assessment Completed: 02/05/2024 07:09:42 Patient is not a candidate for Thrombolytic. Thrombolytic Medical Decision: 02/05/2024 07:09:43 Patient was not deemed candidate for Thrombolytic because of following reasons: LKW outside 4.5 hr window. .  CT Head: I personally reviewed all the CT images that were available to me and it showed: CT head personally reviewed and does not show hemorrhage or definitive acute developing ischemic stroke  Primary Provider Notified of Diagnostic Impression and Management Plan on: 02/05/2024 07:18:01    ------------------------------------------------------------------------------  History of Present Illness: Patient is a 58 year old Male.  Patient was brought by private transportation with symptoms of change in speech. 58 yo M who presents with change in speech and left sided numbness.  Per report, patient went to bed last night normal. Woke up at 3 am with SOB. Stayed  up and went to bathroom at 4 am and noted left facial numbness (SOB prior to that). Has difficulty with speech on arrival (is spanish speaking).  Patient states he went to bed around 7 pm last night normal. Was talking last normal at 6 pm last night. Woke up several times in the night but did not speak as he was alone. States he woke up at 3 am with SOB and between 345 and 4 am noted left hand and face numbness. This continued  for next hour or so. He called family to come get him to take him to hospital and states when he first started talking with family he noted trouble with speech at that time.  Son states patient called him at 450 am. Told him he did not feel good and asked if he would come to take him to hospital. States his speech was not quite normal when speaking to him, having trouble getting out the words he wanted to say. He called uncle to go over first since he lives closer. When he got there he noted patient to be imbalanced. Then they both brought him to hospital. Higinio is at bedside and states he also noted patient having trouble with speech when he first spoke to him this morning around 5 am.  When he got back from CT he is complaining of chest pain and grimacing due to pain.    Past Medical History:      Diabetes Mellitus      There is no history of Stroke   Allergies:  Reviewed  Social History: Smoking: No  Family History:  There Is Family History Of:son had a stroke  ROS : 14 Points Review of Systems was performed and was negative except mentioned in HPI.       Examination: BP(224/157), Pulse(95), 1A: Level of Consciousness - Alert; keenly responsive + 0 1B: Ask Month and Age - Both Questions Right + 0 1C: Blink Eyes & Squeeze Hands - Performs Both Tasks + 0 2: Test Horizontal Extraocular Movements - Normal + 0 3: Test Visual Fields - No Visual Loss + 0 4: Test Facial Palsy (Use Grimace if Obtunded) - Minor paralysis (flat nasolabial fold, smile asymmetry) + 1 5A: Test Left Arm Motor Drift - No Drift for 10 Seconds + 0 5B: Test Right Arm Motor Drift - No Drift for 10 Seconds + 0 6A: Test Left Leg Motor Drift - No Drift for 5 Seconds + 0 6B: Test Right Leg Motor Drift - No Drift for 5 Seconds + 0 7: Test Limb Ataxia (FNF/Heel-Shin) - Ataxia in 1 Limb + 1 8: Test Sensation - Normal; No sensory loss + 0 9: Test Language/Aphasia - Mild-Moderate Aphasia: Some Obvious Changes,  Without Significant Limitation + 1 10: Test Dysarthria - Normal + 0 11: Test Extinction/Inattention - No abnormality + 0  NIHSS Score: 3  NIHSS Free Text : word finding difficulties noted  Pre-Morbid Modified Rankin Scale: 0 Points = No symptoms at all  Spoke with : ED physician Dr. Willo  This consult was conducted in real time using interactive audio and immunologist. Patient was informed of the technology being used for this visit and agreed to proceed. Patient located in hospital and provider located at home/office setting.   Patient is being evaluated for possible acute neurologic impairment and high probability of imminent or life-threatening deterioration. I spent total of 45 minutes providing care to this patient, including time for face to face visit via  telemedicine, review of medical records, imaging studies and discussion of findings with providers, the patient and/or family.    Dr Garnette Medicine   [facility] For Inpatient follow-up with [facility] physician please call [facility] at [phone]. As we are not an outpatient service for any post hospital discharge needs please contact the hospital for assistance. If you have any questions for the [facility] physicians or need to reconsult for clinical or diagnostic changes please contact us  via [facility] at [phone].  Non-radiologist review of imaging performed to assist with emergent clinical decision-making. Remote physician workstations do not possess the same resolution, calibration, or diagnostic capabilities as hospital-based radiology reading stations, and formal radiologist read is necessary.   Signature : [facility]

## 2024-02-05 NOTE — ED Notes (Signed)
 Video interpreter at bedside for tele neurology and family.   [place] [phone]

## 2024-02-05 NOTE — Consult Note (Signed)
 [facility] Tele[facility]rology Consult Services   Patient Name:   Arsenio, Schnorr Date of Birth:   09-25-1965 Identification Number:   MRN - [id number] Date of Service:   02/05/2024 20:25:44  Diagnosis:       I63.89 - Cerebrovascular accident (CVA) due to other mechanism [facility])  Impression:      This is a 58 year old male with history of diabetes who presented this morning with left-sided symptoms found to have an acute stroke in the pons on MRI earlier today. Stroke alert called this evening for left arm drift. In my review of the neurology consultation note from this morning, exam is currently unchanged. He had pronator drift in the left upper extremity as well as drift in the left lower extremity and left facial drooping. NIH stroke scale is 3. Patient reported subjective improvement in his symptoms throughout the day today. He denied worsening symptoms.    Patient had a repeat stat MRI ordered by the primary team. I did not feel this was necessary given no change in the exam based on the prior neurology exam. He is not a TNK candidate given LKW time and the confirmed stroke on MRI from earlier today. Recommended repeating CTA head/neck to ensure no vascular changes or acute LVO. On my review, no acute proximal LVO seen.    I would proceed with stroke workup as previously recommended and continue his dual antiplatelet therapy.    A1C is 8.8% -need to ensure that the patient has good PCP follow-up and glycemic control for goal A1c less than 7% ideally. Lipid panel is pending.    Recommendations:  - TTE with bubble  - Follow-up lipid panel and start statin therapy for goal LDL <70  -PT and OT assessments  - Continue telemetry monitoring  - Holter monitor at discharge with 30 days of monitoring cardiology follow-up       General Lifestyle modifications in vascular disease:  Weight management with a goal of healthy BMI  Smoking cessation (if patient is a smoker)   Mediterranean diet or diet rich in fruits, veggies, whole grains, lean protein, and limiting sugar and processed foods  Regular varied exercise (including aerobic exercise, strength training, and a generally active lifestyle) goal for 60 min or more per week  BP goal normotension in outpatient setting  LDL < 70  PCP assistance to maintain these goals and lifestyle modifications   Our recommendations are outlined below.  Recommendations:        Stroke/Telemetry Floor       Neuro Checks (Q4)       Bedside Swallow Eval       DVT Prophylaxis       IV Fluids, Normal Saline       Head of Bed 30 Degrees       Euglycemia and Avoid Hyperthermia (PRN Acetaminophen )  Sign Out:       Discussed with Primary Attending       Discussed with Rapid Response Team    ------------------------------------------------------------------------------  Advanced Imaging: CTA Head and Neck Completed.  LVO:No  Patient is not a candidate for NIR   Metrics: Last Known Well: 02/04/2024 19:00:00 Activation Time: 02/05/2024 20:25:43 Initial Response Time: 02/05/2024 20:27:00 Symptoms: L arm weakness. Initial patient interaction: 02/05/2024 20:33:26 NIHSS Assessment Completed: 02/05/2024 20:38:00 Patient is not a candidate for Thrombolytic. Thrombolytic Medical Decision: 02/05/2024 20:38:00 Patient was not deemed candidate for Thrombolytic because of following reasons: Significant head trauma or stroke in previous 3 months .  CT Head: I  personally reviewed all the CT images that were available to me and it showed: CT head on admission with an aspects of 10 and no acute intracranial process. MRI brain from earlier today with patchy areas of restricted diffusion in the anterior pons consistent with acute stroke  Primary Provider Notified of Diagnostic Impression and Management Plan on: 02/05/2024 20:46:58 Spoke With: Primary NP Donati-Garmon at bedside Able to Reach 02/05/2024  20:46:58    ------------------------------------------------------------------------------  History of Present Illness: Patient is a 58 year old Male.  Inpatient stroke alert was called for symptoms of L arm weakness. Code stroke activated at 2004 for L arm drift. Patient was admitted to the hospital for stroke earlier today last assessment by day shift nurse was at 1830 this evening and he did not have the L arm weakness at that time. He was scoring a 5 on NIHSS at that time.  Per neurology consult note from this morning. He was last normal at 7 PM last night and woke up today with left-sided numbness and speech changes. Neuroexam documented 4+ out of 5 strength in the left upper extremity with pronator drift and 5- out of 5 strength in the lower extremity also with left facial drooping. CTA with no significant stenosis or large vessel occlusion. 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  Past Surgical History: There Is No Surgical History Contributory To Todays Visit    Examination: BP(161/85), Pulse(79), 1A: Level of Consciousness - Alert; keenly responsive + 0 1B: Ask Month and Age - Both Questions Right + 0 1C: Blink Eyes & Squeeze Hands - Performs Both Tasks + 0 2: Test Horizontal Extraocular Movements - Normal + 0 3: Test Visual Fields - No Visual Loss + 0 4: Test Facial Palsy (Use Grimace if Obtunded) - Minor paralysis (flat nasolabial fold, smile asymmetry) + 1 5A:  Test Left Arm Motor Drift - Drift, but doesn't hit bed + 1 5B: Test Right Arm Motor Drift - No Drift for 10 Seconds + 0 6A: Test Left Leg Motor Drift - Drift, but doesn't hit bed + 1 6B: Test Right Leg Motor Drift - No Drift for 5 Seconds + 0 7: Test Limb Ataxia (FNF/Heel-Shin) - No Ataxia + 0 8: Test Sensation - Normal; No sensory loss + 0 9: Test Language/Aphasia - Normal; No aphasia + 0 10: Test Dysarthria - Normal + 0 11: Test Extinction/Inattention - No abnormality + 0  NIHSS Score: 3   Pre-Morbid Modified Rankin Scale: 2 Points = Slight disability; unable to carry out all previous activities, but able to look after own affairs without assistance  Spoke with : Primary NP Donati-Garmon at bedside  This consult was conducted in real time using interactive audio and immunologist. Patient was informed of the technology being used for this visit and agreed to proceed. Patient located in hospital and provider located at home/office setting.   Patient is being evaluated for possible acute neurologic impairment and high probability of  imminent or life-threatening deterioration. I spent total of 35 minutes providing care to this patient, including time for face to face visit via telemedicine, review of medical records, imaging studies and discussion of findings with providers, the patient and/or family.    Dr Zelda Kluver   [facility] For Inpatient follow-up with [facility] physician please call [facility] at [phone]. As we are not an outpatient service for any post hospital discharge needs please contact the hospital for assistance. If you have any questions for the [facility] physicians or need to reconsult for clinical or diagnostic changes please contact us  via [facility] at [phone].  Non-radiologist review of imaging performed to assist with emergent clinical decision-making. Remote physician workstations do not possess the same resolution, calibration, or  diagnostic capabilities as hospital-based radiology reading stations, and formal radiologist read is necessary.   Signature : Bryse Blanchette Burnett

## 2024-02-05 NOTE — ED Notes (Signed)
 20 IV labetalol  given verbal order Tele Neurology Dr. Lorren. Current BP 207/102

## 2024-02-05 NOTE — ED Triage Notes (Signed)
 Code Stoke Activated at 620  Pt walked in the the ED with gait intability unable to verbalized why he was here. Code stroke was activated based on systems.   Sts he went to sleep per his usually self around 7 pm. Sts he woke up around 3 am d/t SOB. Sat down for sometime. Around 4 am, pt went to the bathroom then developed left sided facial numbness and difficulty finding his words.   Language barrier.

## 2024-02-05 NOTE — ED Notes (Signed)
 Tele Neurologist at bedside DR Garnette

## 2024-02-05 NOTE — Evaluation (Addendum)
 Physical Therapy Evaluation Patient Details Name: Derek George MRN: [id number] DOB: 1965-09-22 Today's Date: 02/05/2024  History of Present Illness  Pt is a 58 y/o M admitted on 02/05/24 after presenting with c/o L sided numbness & changes in speech. MRI shows Patchy foci of restricted diffusion involving the anterior pons consistent  with acute infarction. PMH: DM2  Clinical Impression  Pt seen for PT evaluation with pt agreeable, family arriving during session (iPad interpreter used, Alejandra [phone]), pt received sitting EOB. Prior to admission pt was independent working, driving. On this date, pt endorses impaired sensation in L fingers & toes. Pt reports increasing chest pain with talking, reports it's tight, squeezing, pressure. Nurse called to room to assess & pt assisted with sit>supine with min assist. SpO2 98% on room air, HR 84 bpm, BP in LUE: 191/101 mmHg MAP 127. Pt left in care of nursing staff. Will continue to follow pt acutely & progress mobility as able.      If plan is discharge home, recommend the following: A little help with walking and/or transfers;A little help with bathing/dressing/bathroom;Assistance with cooking/housework;Assist for transportation;Help with stairs or ramp for entrance   Can travel by private vehicle        Equipment Recommendations Other (comment) (TBD)  Recommendations for Other Services       Functional Status Assessment Patient has had a recent decline in their functional status and demonstrates the ability to make significant improvements in function in a reasonable and predictable amount of time.     Precautions / Restrictions Precautions Precautions: Fall Restrictions Weight Bearing Restrictions Per Provider Order: No      Mobility  Bed Mobility Overal bed mobility: Needs Assistance Bed Mobility: Sit to Supine       Sit to supine: Min assist        Transfers                        Ambulation/Gait                   Stairs            Wheelchair Mobility     Tilt Bed    Modified Rankin (Stroke Patients Only)       Balance Overall balance assessment: Needs assistance Sitting-balance support: Feet supported, No upper extremity supported Sitting balance-Leahy Scale: Good                                       Pertinent Vitals/Pain Pain Assessment Pain Assessment: Faces Faces Pain Scale: Hurts whole lot Pain Location: chest Pain Descriptors / Indicators: Tightness, Squeezing, Pressure    Home Living Family/patient expects to be discharged to:: Private residence Living Arrangements: Spouse/significant other;Children Available Help at Discharge: Family Type of Home: House Home Access: Stairs to enter Entrance Stairs-Rails: Right;Left;Can reach both Secretary/administrator of Steps: 2-4   Home Layout: One level        Prior Function Prior Level of Function : Independent/Modified Independent;Working/employed;Driving                     Extremity/Trunk Assessment   Upper Extremity Assessment Upper Extremity Assessment: LUE deficits/detail;Right hand dominant (unable to fully assess during session, notes impaired sensation in L fingers/toes)    Lower Extremity Assessment Lower Extremity Assessment: LLE deficits/detail (unable to fully assess during session, notes impaired sensation in L  fingers/toes)       Communication   Communication Factors Affecting Communication:  (Spanish speaking, iPad interpreter utilized, Cy, [phone]))    Cognition Arousal: Alert Behavior During Therapy: [facility] for tasks assessed/performed (becomes anxious with chest pain)                             Following commands: Impaired Following commands impaired: Follows multi-step commands with increased time     Cueing Cueing Techniques: Verbal cues     General Comments      Exercises     Assessment/Plan    PT Assessment Patient  needs continued PT services  PT Problem List Decreased strength;Decreased coordination;Pain;Decreased range of motion;Decreased activity tolerance;Decreased knowledge of use of DME;Impaired sensation;Decreased balance;Decreased safety awareness;Decreased mobility;Decreased knowledge of precautions       PT Treatment Interventions DME instruction;Therapeutic exercise;Gait training;Balance training;Stair training;Neuromuscular re-education;Functional mobility training;Cognitive remediation;Therapeutic activities;Patient/family education    PT Goals (Current goals can be found in the Care Plan section)  Acute Rehab PT Goals Patient Stated Goal: none stated PT Goal Formulation: With patient/family Time For Goal Achievement: 02/19/24 Potential to Achieve Goals: Good    Frequency Min 3X/week     Co-evaluation               AM-PAC PT 6 Clicks Mobility  Outcome Measure Help needed turning from your back to your side while in a flat bed without using bedrails?: A Little Help needed moving from lying on your back to sitting on the side of a flat bed without using bedrails?: A Little Help needed moving to and from a bed to a chair (including a wheelchair)?: A Little Help needed standing up from a chair using your arms (e.g., wheelchair or bedside chair)?: A Little Help needed to walk in hospital room?: A Lot Help needed climbing 3-5 steps with a railing? : Total 6 Click Score: 15    End of Session   Activity Tolerance:  (limited 2/2 chest pain) Patient left: in bed;with call bell/phone within reach;with nursing/sitter in room;with family/visitor present Nurse Communication: Mobility status (events of session) PT Visit Diagnosis: Other abnormalities of gait and mobility (R26.89);Muscle weakness (generalized) (M62.81);Pain Pain - Right/Left: Left Pain - part of body:  (chest)    Time: 8459-8396 PT Time Calculation (min) (ACUTE ONLY): 23 min   Charges:   PT Evaluation $PT Eval  Low Complexity: 1 Low   PT General Charges $$ ACUTE PT VISIT: 1 Visit         [name], PT, DPT 02/05/2024, 4:13 PM   [name] 02/05/2024, 4:13 PM

## 2024-02-05 NOTE — ED Notes (Signed)
Code Stroke activated

## 2024-02-05 NOTE — ED Notes (Signed)
 Pt arrived to ED with family with sudden onset of expressive aphasia and weakness on one side. Pt awoke like this and came straight to ED.

## 2024-02-05 NOTE — ED Notes (Signed)
 EDP [place] at bedside in CT scan

## 2024-02-05 NOTE — H&P (Signed)
 " History and Physical    Patient: Derek George FMW:[id number] DOB: [birth year] DOA: 02/05/2024 DOS: the patient was seen and examined on 02/05/2024 PCP: Patient, No Pcp Per  Patient coming from: Home  Chief Complaint:  Chief Complaint  Patient presents with   Code Stroke   HPI: [name] is a 58 y.o. male with medical history significant of type II diabetes  comes to the emergency room accompanied by son with complaints of slurred speech and left-sided weakness upper and lower extremity.  Patient is Spanish-speaking. History is obtained with assistance of son and brother. Patient said he went to bed around 7 PM woke around 3 AM complaining of left-sided numbness and difficulty speaking. His son spoke with him on the phone and he could not understand much went to the house brought him here to the emergency room.  Patient was brought to the emergency room. He is stroke code stroke was initiated. CT head and CT angiogram head and neck was negative. Patient was out of the window for TPA. Started on aspirin  with Plavix  and statins. MRI brain done results pending. Patient is being admitted for further evaluation management. His left sided weakness was improving.  Review of Systems: As mentioned in the history of present illness. All other systems reviewed and are negative. Past Medical History:  Diagnosis Date   Diabetes mellitus without complication (HCC)    Past Surgical History:  Procedure Laterality Date   APPENDECTOMY     CHOLECYSTECTOMY N/A 11/07/2018   Procedure: LAPAROSCOPIC CHOLECYSTECTOMY WITH INTRAOPERATIVE CHOLANGIOGRAM;  Surgeon: Jordis Laneta FALCON, MD;  Location: [facility] ORS;  Service: General;  Laterality: N/A;   ERCP N/A 11/09/2018   Procedure: ENDOSCOPIC RETROGRADE CHOLANGIOPANCREATOGRAPHY (ERCP);  Surgeon: Jinny Carmine, MD;  Location: [facility] ENDOSCOPY;  Service: Endoscopy;  Laterality: N/A;   Social History:  reports that he has never smoked. He has never used smokeless  tobacco. He reports that he does not drink alcohol and does not use drugs.  Allergies[1]  No family history on file.  Prior to Admission medications  Medication Sig Start Date End Date Taking? Authorizing Provider  ibuprofen  (ADVIL ) 600 MG tablet Take 1 tablet (600 mg total) by mouth every 8 (eight) hours as needed for fever, mild pain or moderate pain. 11/10/18   Desiderio Schanz, MD    Physical Exam: Vitals:   02/05/24 0625 02/05/24 0636 02/05/24 0645 02/05/24 0659  BP: (!) 224/157 (!) 207/102 (!) 173/77 (!) 184/91  Pulse: 95 94 83 84  Resp: 16 19 18    Temp: 98.7 F (37.1 C)  98.4 F (36.9 C) 98.4 F (36.9 C)  TempSrc: Oral  Oral   SpO2: 100% 100% 98% 98%   Physical Exam Constitutional:      Appearance: Normal appearance. He is obese.  Eyes:     Extraocular Movements: Extraocular movements intact.     Pupils: Pupils are equal, round, and reactive to light.  Cardiovascular:     Rate and Rhythm: Normal rate and regular rhythm.  Pulmonary:     Effort: Pulmonary effort is normal.     Breath sounds: Normal breath sounds.  Abdominal:     General: Abdomen is flat.     Palpations: Abdomen is soft.  Musculoskeletal:     Cervical back: Normal range of motion.  Skin:    General: Skin is warm and dry.  Neurological:     General: No focal deficit present.     Mental Status: He is alert and oriented to person, place, and  time.     Motor: Weakness present.       Assessment and Plan:  Brainard George is a 58 y.o. male with medical history significant of type II diabetes  comes to the emergency room accompanied by son with complaints of slurred speech and left-sided weakness upper and lower extremity.  Patient is Spanish-speaking. History is obtained with assistance of son and brother. Patient said he went to bed around 7 PM woke around 3 AM complaining of left-sided numbness and difficulty speaking. His son spoke with him on the phone and he could not understand much went to the  house brought him here to the emergency room.  Left upper and lower extremity weakness which slurred speech rule out CVA -- patient symptoms improving. Not ITP candidate as determined by tele neuro- -- MRI brain done results pending -- neurology recommends start aspirin  81 mg daily and Plavix  75 mg daily -- continue statins -- PT OT to see patient patient--past swallow eval  Type II diabetes, uncontrolled -- diabetes coordinator, sliding scale insulin . Will resume home meds once med rec done -- check A1c  Hypertension -- will allow permissive hypertension for now -- resume home meds from tomorrow if remains stable  Obesity -- diet and exercise discussed with patient   Advance Care Planning:   Code Status: Prior full  Consults: neurology  Family Communication: son Jenna in the ER  Severity of Illness: The appropriate patient status for this patient is OBSERVATION. Observation status is judged to be reasonable and necessary in order to provide the required intensity of service to ensure the patient's safety. The patient's presenting symptoms, physical exam findings, and initial radiographic and laboratory data in the context of their medical condition is felt to place them at decreased risk for further clinical deterioration. Furthermore, it is anticipated that the patient will be medically stable for discharge from the hospital within 2 midnights of admission.   Author: Leita Blanch, MD 02/05/2024 7:40 AM  For on call review [email or web address].      [1]  Allergies Allergen Reactions   Metformin Shortness Of Breath    Reports liver damage Reports liver damage Reports liver damage    "

## 2024-02-05 NOTE — ED Notes (Signed)
Arrival to CT  

## 2024-02-05 NOTE — Progress Notes (Signed)
 This RN called to pt s room that  pt having chest pain.  pt was holding his chest, moaning and groaning in pain when RN entered to his room. Pt verbalize pain in only in his chest not radiating to arm or anywhere else. Vitals checked. Blood pressure in 170s.  EKG done as protocol. MD made aware. MD order nitroglycerin  . RN gave him one dose of nitroglycerin  and rechecked his Blood pressure. Blood pressure came down to 147/92. On reassessment of pain , pt stated that his pain going away. Gave him a dose of tyelnol too as MD order.

## 2024-02-05 NOTE — Consult Note (Signed)
 Reason for Consult:Acute infarct Requesting Physician: Tobie  CC: Left sided numbness  I have been asked by Dr. Tobie to see this patient in consultation for acute infarct.  All history obtained from patient through interpreter  HPI: Derek George is an 58 y.o. male with a history of DM who presented with complaints of left sided numbness and change in speech.  Patient reports going to bed at baseline.  Awakened this morning and noted the change in speech and left sided numbness.  Was brought on for evaluation at that time.  LKW 1900 on 02/04/2024.  Patient outside time window for TNK.  No target lesion identified therefore patient not a thrombolytic candidate.    Past Medical History:  Diagnosis Date   Diabetes mellitus without complication (HCC)     Past Surgical History:  Procedure Laterality Date   APPENDECTOMY     CHOLECYSTECTOMY N/A 11/07/2018   Procedure: LAPAROSCOPIC CHOLECYSTECTOMY WITH INTRAOPERATIVE CHOLANGIOGRAM;  Surgeon: Jordis Laneta FALCON, MD;  Location: [facility] ORS;  Service: General;  Laterality: N/A;   ERCP N/A 11/09/2018   Procedure: ENDOSCOPIC RETROGRADE CHOLANGIOPANCREATOGRAPHY (ERCP);  Surgeon: Jinny Carmine, MD;  Location: [facility] ENDOSCOPY;  Service: Endoscopy;  Laterality: N/A;    No family history on file.  Social History:  reports that he has never smoked. He has never used smokeless tobacco. He reports that he does not drink alcohol and does not use drugs.  Allergies[1]  Medications: I have reviewed the patient's current medications. Prior to Admission:  Medications Prior to Admission  Medication Sig Dispense Refill Last Dose/Taking   amLODipine  (NORVASC ) 5 MG tablet Take 5 mg by mouth daily.   02/05/2024 at  5:00 AM   ibuprofen  (ADVIL ) 600 MG tablet Take 1 tablet (600 mg total) by mouth every 8 (eight) hours as needed for fever, mild pain or moderate pain. 30 tablet 0 Unknown   TRADJENTA  5 MG TABS tablet Take 5 mg by mouth daily.   02/05/2024 at  5:00 AM    Scheduled:  [START ON 02/06/2024]  stroke: early stages of recovery book   Does not apply Once   enoxaparin  (LOVENOX ) injection  40 mg Subcutaneous Q24H   insulin  aspart  0-5 Units Subcutaneous QHS   insulin  aspart  0-9 Units Subcutaneous TID WC   sodium chloride  flush  3 mL Intravenous Once    ROS: History obtained from the patient  General ROS: negative for - chills, fatigue, fever, night sweats, weight gain or weight loss Psychological ROS: negative for - behavioral disorder, hallucinations, memory difficulties, mood swings or suicidal ideation Ophthalmic ROS: negative for - blurry vision, double vision, eye pain or loss of vision ENT ROS: negative for - epistaxis, nasal discharge, oral lesions, sore throat, tinnitus or vertigo Allergy and Immunology ROS: negative for - hives or itchy/watery eyes Hematological and Lymphatic ROS: negative for - bleeding problems, bruising or swollen lymph nodes Endocrine ROS: negative for - galactorrhea, hair pattern changes, polydipsia/polyuria or temperature intolerance Respiratory ROS: negative for - cough, hemoptysis, shortness of breath or wheezing Cardiovascular ROS: negative for - chest pain, dyspnea on exertion, edema or irregular heartbeat Gastrointestinal ROS: negative for - abdominal pain, diarrhea, hematemesis, nausea/vomiting or stool incontinence Genito-Urinary ROS: negative for - dysuria, hematuria, incontinence or urinary frequency/urgency Musculoskeletal ROS: negative for - joint swelling or muscular weakness Neurological ROS: as noted in HPI Dermatological ROS: negative for rash and skin lesion changes   Physical Examination: Blood pressure (!) 168/148, pulse 78, temperature 98 F (36.7 C), resp. rate  18, SpO2 98%.  HEENT-  Normocephalic, no lesions, without obvious abnormality.  Normal external eye and conjunctiva.  Normal external ears. Normal external nose, mucus membranes and septum. Cardiovascular- Single S1, S2 Lungs-  CTA Abdomen- soft, non-tender; bowel sounds normal; no masses,  no organomegaly Extremities- no edema Musculoskeletal-no joint tenderness, deformity or swelling Skin-warm and dry, no hyperpigmentation, vitiligo, or suspicious lesions  Neurological Examination   Mental Status: Alert, oriented, thought content appropriate.  Speech fluent without evidence of aphasia.  Able to follow 3 step commands without difficulty.  Dysarthric Cranial Nerves: II: Visual fields grossly normal III,IV, VI: ptosis not present, extra-ocular motions intact bilaterally V,VII: left facial droop, facial light touch sensation normal bilaterally VIII: hearing normal bilaterally XI: bilateral shoulder shrug XII: midline tongue extension Motor: Right : Upper extremity   5/5    Left:     Upper extremity   4+/5 with pronator drift  Lower extremity   5/5     Lower extremity   5-/5 Tone and bulk:normal tone throughout; no atrophy noted Sensory: Pinprick and light touch intact throughout, bilaterally Deep Tendon Reflexes: 2+ and symmetric throughout Plantars: Right: upgoing   Left: upgoing Cerebellar: normal finger-to-nose and normal heel-to-shin testing bilaterally Gait: uses walker with limp on the left      Laboratory Studies:   Basic Metabolic Panel: Recent Labs  Lab 02/05/24 0621 02/05/24 1128  NA 135  --   K 4.3  --   CL 100  --   CO2 26  --   GLUCOSE 339*  --   BUN 18  --   CREATININE 1.01 0.90  CALCIUM  9.1  --     Liver Function Tests: Recent Labs  Lab 02/05/24 0621  AST 25  ALT 25  ALKPHOS 100  BILITOT 0.2  PROT 7.1  ALBUMIN 4.0   No results for input(s): LIPASE, AMYLASE in the last 168 hours. No results for input(s): AMMONIA in the last 168 hours.  CBC: Recent Labs  Lab 02/05/24 0621 02/05/24 1128  WBC 8.5 10.5  NEUTROABS 6.6  --   HGB 14.2 14.8  HCT 41.5 42.6  MCV 89.1 87.3  PLT 248 251    Cardiac Enzymes: No results for input(s): CKTOTAL, CKMB,  CKMBINDEX, TROPONINI in the last 168 hours.  BNP: Invalid input(s): POCBNP  CBG: Recent Labs  Lab 02/05/24 0633 02/05/24 0812 02/05/24 1101  GLUCAP 312* 257* 194*    Microbiology: Results for orders placed or performed during the hospital encounter of 11/07/18  SARS Coronavirus 2 [facility] order, Performed in [facility] hospital lab) Nasopharyngeal Nasopharyngeal Swab     Status: None   Collection Time: 11/07/18  1:14 AM   Specimen: Nasopharyngeal Swab  Result Value Ref Range Status   SARS Coronavirus 2 NEGATIVE NEGATIVE Final    Comment: (NOTE) If result is NEGATIVE SARS-CoV-2 target nucleic acids are NOT DETECTED. The SARS-CoV-2 RNA is generally detectable in upper and lower  respiratory specimens during the acute phase of infection. The lowest  concentration of SARS-CoV-2 viral copies this assay can detect is 250  copies / mL. A negative result does not preclude SARS-CoV-2 infection  and should not be used as the sole basis for treatment or other  patient management decisions.  A negative result may occur with  improper specimen collection / handling, submission of specimen other  than nasopharyngeal swab, presence of viral mutation(s) within the  areas targeted by this assay, and inadequate number of viral copies  (<250 copies /  mL). A negative result must be combined with clinical  observations, patient history, and epidemiological information. If result is POSITIVE SARS-CoV-2 target nucleic acids are DETECTED. The SARS-CoV-2 RNA is generally detectable in upper and lower  respiratory specimens dur ing the acute phase of infection.  Positive  results are indicative of active infection with SARS-CoV-2.  Clinical  correlation with patient history and other diagnostic information is  necessary to determine patient infection status.  Positive results do  not rule out bacterial infection or co-infection with other viruses. If result is PRESUMPTIVE POSTIVE SARS-CoV-2  nucleic acids MAY BE PRESENT.   A presumptive positive result was obtained on the submitted specimen  and confirmed on repeat testing.  While 2019 novel coronavirus  (SARS-CoV-2) nucleic acids may be present in the submitted sample  additional confirmatory testing may be necessary for epidemiological  and / or clinical management purposes  to differentiate between  SARS-CoV-2 and other Sarbecovirus currently known to infect humans.  If clinically indicated additional testing with an alternate test  methodology [phone]) is advised. The SARS-CoV-2 RNA is generally  detectable in upper and lower respiratory sp ecimens during the acute  phase of infection. The expected result is Negative. Fact Sheet for Patients:  boilerbrush.com.cy Fact Sheet for Healthcare Providers: https://pope.com/ This test is not yet approved or cleared by the [place]  FDA and has been authorized for detection and/or diagnosis of SARS-CoV-2 by FDA under an Emergency Use Authorization (EUA).  This EUA will remain in effect (meaning this test can be used) for the duration of the COVID-19 declaration under Section 564(b)(1) of the Act, 21 U.S.C. section 360bbb-3(b)(1), unless the authorization is terminated or revoked sooner. Performed at [facility], [address] [place], [place] [address]   Culture, blood (routine x 2)     Status: None   Collection Time: 11/08/18  2:33 PM   Specimen: BLOOD  Result Value Ref Range Status   Specimen Description BLOOD RIGHT ANTECUBITAL  Final   Special Requests   Final    Blood Culture results may not be optimal due to an inadequate volume of blood received in culture bottles   Culture   Final    NO GROWTH 5 DAYS Performed at [facility], [address], [place], [place] [address]    Report Status 11/13/2018 FINAL  Final  Culture, blood (routine x 2)     Status: None   Collection Time: 11/08/18  3:39 PM    Specimen: BLOOD  Result Value Ref Range Status   Specimen Description BLOOD BLOOD RIGHT FOREARM  Final   Special Requests Blood Culture adequate volume  Final   Culture   Final    NO GROWTH 5 DAYS Performed at [facility], [address], [place], [place] [address]    Report Status 11/13/2018 FINAL  Final    Coagulation Studies: Recent Labs    02/05/24 0621  LABPROT 12.7  INR 0.9    Urinalysis: No results for input(s): COLORURINE, LABSPEC, PHURINE, GLUCOSEU, HGBUR, BILIRUBINUR, KETONESUR, PROTEINUR, UROBILINOGEN, NITRITE, LEUKOCYTESUR in the last 168 hours.  Invalid input(s): APPERANCEUR  Lipid Panel:  No results found for: CHOL, TRIG, HDL, CHOLHDL, VLDL, LDLCALC  HgbA1C:  Lab Results  Component Value Date   HGBA1C 12.4 (H) 11/07/2018    Urine Drug Screen:  No results found for: LABOPIA, COCAINSCRNUR, LABBENZ, AMPHETMU, THCU, LABBARB  Alcohol Level:  Recent Labs  Lab 02/05/24 0621  ETH <15    Other results: EKG: sinus  rhythm at 85 bpm.  Imaging: DG Chest Portable 1 View Result Date: 02/05/2024 CLINICAL DATA:  Shortness of breath.  Code stroke. EXAM: PORTABLE CHEST 1 VIEW COMPARISON:  None Available. FINDINGS: Low volume film. The lungs are clear without focal pneumonia, edema, pneumothorax or pleural effusion. Cardiopericardial silhouette is at upper limits of normal for size. No acute bony abnormality. Telemetry leads overlie the chest. IMPRESSION: Low volume film without acute cardiopulmonary findings. Electronically Signed   By: Camellia Candle M.D.   On: 02/05/2024 07:19   CT ANGIO HEAD NECK W WO CM W PERF (CODE STROKE) Result Date: 02/05/2024 EXAM: CTA Head and Neck with Perfusion 02/05/2024 07:00:23 AM TECHNIQUE: CTA of the head and neck was performed without and with the administration of intravenous contrast. 3D postprocessing with multiplanar reconstructions and MIPs was performed to evaluate the  vascular anatomy. Cerebral perfusion analysis using computed tomography with contrast administration, including post-processing of parametric maps with determination of cerebral blood flow, cerebral blood volume, mean transit time and time-to-maximum. Automated exposure control, iterative reconstruction, and/or weight based adjustment of the mA/kV was utilized to reduce the radiation dose to as low as reasonably achievable. COMPARISON: CT of the head dated 02/05/2024 CLINICAL HISTORY: Neuro deficit, acute, stroke suspected. FINDINGS: CTA NECK: AORTIC ARCH AND ARCH VESSELS: Mild calcific plaque within the aortic arch. No dissection or arterial injury. No significant stenosis of the brachiocephalic or subclavian arteries. CERVICAL CAROTID ARTERIES: No dissection, arterial injury, or hemodynamically significant stenosis by NASCET criteria. CERVICAL VERTEBRAL ARTERIES: No dissection, arterial injury, or significant stenosis. LUNGS AND MEDIASTINUM: Unremarkable. SOFT TISSUES: No acute abnormality. BONES: No acute abnormality. CTA HEAD: ANTERIOR CIRCULATION: No significant stenosis of the internal carotid arteries. No significant stenosis of the anterior cerebral arteries. No significant stenosis of the middle cerebral arteries. No aneurysm. POSTERIOR CIRCULATION: No significant stenosis of the posterior cerebral arteries. No significant stenosis of the basilar artery. No significant stenosis of the vertebral arteries. No aneurysm. OTHER: No dural venous sinus thrombosis on this non-dedicated study. The above findings were discussed with Dr. Willo at 07:09 am 02/05/2024. CT PERFUSION: EXAM QUALITY: Exam quality is adequate with diagnostic perfusion maps. No significant motion artifact. Appropriate arterial inflow and venous outflow curves. CORE INFARCT (CBF<30% volume): 0 mL TOTAL HYPOPERFUSION (Tmax>6s volume): 0 mL PENUMBRA: Mismatch volume: 0 mL Mismatch ratio: not applicable Location: not applicable IMPRESSION: 1. No  acute large vessel occlusion. 2. No hemodynamically significant stenosis or aneurysm in the head or neck vessels. Mild calcific plaque within the aortic arch. 3. No evidence of ischemia by CT brain perfusion. 4. Findings discussed with Dr. Willo at 07:09 AM on 02/05/2024. Electronically signed by: Evalene Coho MD 02/05/2024 07:11 AM EST RP Workstation: [id number]   CT HEAD CODE STROKE WO CONTRAST Addendum Date: 02/05/2024  ADDENDUM #1 ADDENDUM: Aspects 10. Findings discussed with Dr. Willo 6:37 AM 02/05/24. ---------------------------------------------------- Electronically signed by: Evalene Coho MD 02/05/2024 06:37 AM EST RP Workstation: [id number]   Result Date: 02/05/2024 ORIGINAL REPORT  EXAM: CT HEAD WITHOUT CONTRAST 02/05/2024 06:29:40 AM TECHNIQUE: CT of the head was performed without the administration of intravenous contrast. Automated exposure control, iterative reconstruction, and/or weight based adjustment of the mA/kV was utilized to reduce the radiation dose to as low as reasonably achievable. COMPARISON: None available. CLINICAL HISTORY: Neuro deficit, acute, stroke suspected. FINDINGS: BRAIN AND VENTRICLES: No acute hemorrhage. No evidence of acute infarct. No hydrocephalus. No extra-axial collection. No mass effect or midline shift. ORBITS: Bilateral lens replacement. SINUSES: Polypoid mucosal thickening  in right maxillary sinus. SOFT TISSUES AND SKULL: No acute soft tissue abnormality. No skull fracture. IMPRESSION: 1. No acute intracranial abnormality. 2. Polypoid mucosal thickening in the right maxillary sinus. Electronically signed by: Evalene Coho MD 02/05/2024 06:32 AM EST RP Workstation: [id number]   EXAM: MRI BRAIN WITHOUT CONTRAST 02/05/2024 12:36:07 PM   TECHNIQUE: Multiplanar multisequence MRI of the head/brain was performed without the administration of intravenous contrast.   COMPARISON: CT Head 02/05/2024.   CLINICAL HISTORY: Stroke, follow up.  Change in speech and left sided numbness.   FINDINGS:   BRAIN AND VENTRICLES: Patchy foci of restricted diffusion involving the bilateral anterior pons with associated T2/FLAIR hyperintensity. Mild to moderate subcortical and periventricular T2 and FLAIR signal hyperintensity likely reflecting sequelae of chronic microvascular ischemia. 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 A couple small foci of susceptibility within the left parietal/ occipital lobe which may relate to the sequelae of prior micro hemorrhage. No mass. No midline shift. No hydrocephalus. The sella is unremarkable. Normal flow voids.   ORBITS: Bilateral lens replacement.   SINUSES AND MASTOIDS: Polypoid mucosal thickening right maxillary sinus. Trace left mastoid effusion.   BONES AND SOFT TISSUES: Normal marrow signal. No acute soft tissue abnormality.   IMPRESSION: 1. Patchy foci of restricted diffusion involving the anterior pons consistent with acute infarction. 2. No acute intracranial hemorrhage.    Assessment/Plan:  58 y.o. male with a history of DM who presented with complaints of left sided numbness and change in speech.  Patient reports going to bed at baseline.  Awakened this morning and noted the change in speech and left sided numbness.  Was brought on for evaluation at that time.  LKW 1900 on 02/04/2024.  Patient outside time window for TNK.  No target lesion identified therefore patient not a thrombolytic candidate.  MRI of the brain personally reviewed and reveals anterior pontine acute infarct. Etiology likely small vessel disease  Stroke risk factors: DM  1. HgbA1c, fasting lipid panel pending.  Goal A1c<7.0, goal LDL<70 2. High intensity statin initiation 3. PT consult, OT consult, Speech consult 4. Echocardiogram pending 5. Prophylactic therapy-Dual antiplatelet therapy with ASA 81mg  and Plavix  75mg  for three weeks with change  to ASA 81mg  alone as monotherapy after that time.  6. Permissive BP management with no treatment of SBP<200 for the first 24-48 hours.  Goal BP after that time <140/80  7. NPO until RN stroke swallow screen 8. Telemetry monitoring 9. Frequent neuro checks   Case discussed with Dr. Tobie Sonny Hock, MD Neurology  02/05/2024, 1:40 PM           [1]  Allergies Allergen Reactions   Metformin Shortness Of Breath    Reports liver damage Reports liver damage Reports liver damage

## 2024-02-05 NOTE — Progress Notes (Signed)
 Rapid Response Event Note   Reason for Call : Code stroke, left sided weakness   Initial Focused Assessment: On my arrival pt is alert. Pt is spanish speaking but interpreter is present. Pt is oriented X4. Pt admitted earlier with left sided weakness and confirmed stroke. NIH was 5 on arrival. Primary RN states that left arm drift was not mentioned in report but that she noticed a drift upon assessment. Code stroke was called. Laneta, NP was at bedside performing NIH on my arrival and NIH was 6.  Pt does have left sided weakness in arm. Dr. Shona also at bedside. Pt aslso c/o chest tightness on arrival to unit and nitroglycerin  was given.   Interventions: CBG checked and is 129. HR 79, BP 161/85, O2 96% on room air. 12 lead EKG performed and placed in chart. Troponin ordered. MRI ordered. MRI was unable to take pt immediately. Natalie, NP made aware. Teleneuro cart was activated in pt's room at 2024. Teleneurologist assessed patient and also ordered CT angio. Pt's LKW was 7pm yesterday. Pt states he feels like his symptoms are improving from this morning.   Plan of Care: Pt taken to CT and MRI and will remain on 1 C at this time. No immediate needs from primary RN.    Event Summary:   MD Notified: Dr. Rawland Laneta, NP Call Time: 2020 Arrival Time: 2022 End Time:2100  Duwaine LOISE Cools, RN

## 2024-02-05 NOTE — Progress Notes (Signed)
 "       Overnight   NAME: Jquan Egelston MRN: [id number] DOB : [birth year]    Date of Service   02/05/2024   HPI/Events of Note   HPI: 58 year old male with medical history significant for type 2 diabetes, who presented to the emergency department with complaints of slurred speech and left-sided weakness.  Patient is Spanish-speaking history was obtained with assistance from his brother and son.  Patient states he went to bed around 7 PM and woke up at 3 AM complaining of left-sided numbness and difficulty speaking.  His son spoke to him on the phone and he could not understand him so he picked him up and brought him to the emergency room.  Code stroke was initiated in the emergency room CT head and CT angiogram were both negative patient was out of the window for tPA.  Was placed on aspirin  and Plavix  and started on statins.  MRI brain with patchy foci of restricted diffusion involving the anterior pons consistent with an acute infarction and no acute intracranial hemorrhage.   Overnight: Code stroke was called by RN at bedside due to new onset of left-sided drift following acute episode of chest pain with nitroglycerin  administration.  Physical Exam Vitals and nursing note reviewed.  Constitutional:      Appearance: He is obese.  HENT:     Mouth/Throat:     Mouth: Mucous membranes are moist.     Tongue: Tongue does not deviate from midline.  Eyes:     Pupils: Pupils are equal, round, and reactive to light.  Pulmonary:     Effort: Pulmonary effort is normal.     Breath sounds: Normal breath sounds.  Abdominal:     General: Bowel sounds are normal.  Skin:    General: Skin is warm and dry.     Capillary Refill: Capillary refill takes 2 to 3 seconds.  Neurological:     Mental Status: He is alert and oriented to person, place, and time.     GCS: GCS eye subscore is 4. GCS verbal subscore is 5. GCS motor subscore is 6.     Cranial Nerves: Dysarthria and facial asymmetry present.      Sensory: Sensory deficit present.     Motor: Weakness present.     Comments: Left sided facial drop, tongue midline  Right arm weakness @ bicept and tricept 2/5  Left arm 5/5  Psychiatric:        Cognition and Memory: Cognition normal.       Dizziness Present: No (12/22 2000) Headache Present: No (12/22 2000) Interval: Neuro change (12/22 2000) Level of Consciousness (1a.)   : Alert, keenly responsive (12/22 2000) LOC Questions (1b. )   : Answers both questions correctly (12/22 2000) LOC Commands (1c. )   : Performs both tasks correctly (12/22 2000) Best Gaze (2. )  : Normal (12/22 2000) Visual (3. )  : No visual loss (12/22 2000) Facial Palsy (4. )    : Minor paralysis (12/22 2000) Motor Arm, Left (5a. )   : Drift (12/22 2000) Motor Arm, Right (5b. ) : No drift (12/22 2000) Motor Leg, Left (6a. )  : No drift (12/22 2000) Motor Leg, Right (6b. ) : No drift (12/22 2000) Limb Ataxia (7. ): Present in one limb (12/22 2000) Sensory (8. )  : Mild-to-moderate sensory loss, patient feels pinprick is less sharp or is dull on the affected side, or there is a loss of superficial pain with  pinprick, but patient is aware of being touched (12/22 2000) Best Language (9. )  : Mild-to-moderate aphasia (12/22 2000) Dysarthria (10. 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

## 2024-02-06 ENCOUNTER — Other Ambulatory Visit: Payer: Self-pay

## 2024-02-06 ENCOUNTER — Encounter: Payer: Self-pay | Admitting: Internal Medicine

## 2024-02-06 ENCOUNTER — Observation Stay
Admit: 2024-02-06 | Discharge: 2024-02-06 | Disposition: A | Discharge status: Home / Self Care | Payer: Self-pay | Attending: Internal Medicine | Admitting: Internal Medicine

## 2024-02-06 DIAGNOSIS — E1169 Type 2 diabetes mellitus with other specified complication: Secondary | ICD-10-CM

## 2024-02-06 DIAGNOSIS — R531 Weakness: Secondary | ICD-10-CM

## 2024-02-06 DIAGNOSIS — E119 Type 2 diabetes mellitus without complications: Secondary | ICD-10-CM

## 2024-02-06 DIAGNOSIS — I359 Nonrheumatic aortic valve disorder, unspecified: Secondary | ICD-10-CM

## 2024-02-06 DIAGNOSIS — I10 Essential (primary) hypertension: Secondary | ICD-10-CM

## 2024-02-06 DIAGNOSIS — I639 Cerebral infarction, unspecified: Secondary | ICD-10-CM

## 2024-02-06 LAB — ECHOCARDIOGRAM COMPLETE
AR max vel: 2.89 cm2
AV Area VTI: 2.96 cm2
AV Area mean vel: 2.8 cm2
AV Mean grad: 2 mmHg
AV Peak grad: 3 mmHg
Ao pk vel: 0.87 m/s
Area-P 1/2: 3.93 cm2
MV VTI: 1.95 cm2
S' Lateral: 3 cm

## 2024-02-06 LAB — LIPID PANEL
Cholesterol: 190 mg/dL (ref 0–200)
HDL: 31 mg/dL — ABNORMAL LOW
LDL Cholesterol: 124 mg/dL — ABNORMAL HIGH (ref 0–99)
Total CHOL/HDL Ratio: 6.1 ratio
Triglycerides: 173 mg/dL — ABNORMAL HIGH
VLDL: 35 mg/dL (ref 0–40)

## 2024-02-06 LAB — GLUCOSE, CAPILLARY
Glucose-Capillary: 171 mg/dL — ABNORMAL HIGH (ref 70–99)
Glucose-Capillary: 201 mg/dL — ABNORMAL HIGH (ref 70–99)
Glucose-Capillary: 166 mg/dL — ABNORMAL HIGH (ref 70–99)
Glucose-Capillary: 100 mg/dL — ABNORMAL HIGH (ref 70–99)

## 2024-02-06 LAB — TROPONIN T, HIGH SENSITIVITY
Troponin T High Sensitivity: 37 ng/L — ABNORMAL HIGH (ref 0–19)
Troponin T High Sensitivity: 39 ng/L — ABNORMAL HIGH (ref 0–19)

## 2024-02-06 MED ORDER — AMLODIPINE BESYLATE 5 MG PO TABS
5.0000 mg | ORAL_TABLET | Freq: Every day | ORAL | Status: DC
  Administered 2024-02-06: 5 mg via ORAL
  Filled 2024-02-06: qty 1

## 2024-02-06 MED ORDER — EZETIMIBE 10 MG PO TABS
10.0000 mg | ORAL_TABLET | Freq: Every day | ORAL | Status: DC
  Administered 2024-02-06: 10 mg via ORAL
  Filled 2024-02-06: qty 1

## 2024-02-06 MED ORDER — ROSUVASTATIN CALCIUM 20 MG PO TABS
40.0000 mg | ORAL_TABLET | Freq: Every day | ORAL | Status: DC
  Administered 2024-02-06: 40 mg via ORAL
  Filled 2024-02-06: qty 2

## 2024-02-06 MED ORDER — GLIPIZIDE 10 MG PO TABS
10.0000 mg | ORAL_TABLET | Freq: Two times a day (BID) | ORAL | Status: DC
  Administered 2024-02-06 – 2024-02-07 (×2): 10 mg via ORAL
  Filled 2024-02-06 (×3): qty 1

## 2024-02-06 NOTE — Plan of Care (Signed)
 Spoke with daughter and patient via interpreter regarding diet, lifestyle, activity level, etc.. They are aware of his diabetes and state they try to eat healthy and exercise.

## 2024-02-06 NOTE — Progress Notes (Signed)
 [facility]  - [place] [facility]   PATIENT NAME: Derek George    MR#:  [id number]  DATE OF BIRTH:  Oct 25, 1965  SUBJECTIVE:  via video Spanish interpreter and son Derek George at bedside. Patient sitting up in the chair trying to eat breakfast earlier. Per son speech is much improving. Patient does have left upper and lower extremity weakness which appears mild. Rapid response was called for left-sided weakness. Repeat CT scan of the head and neck was done. Neurology Dr. Sonny Hock aware. Continue present management.    VITALS:  Blood pressure (!) 152/76, pulse 75, temperature 98.4 F (36.9 C), temperature source Oral, resp. rate 18, height 5' 5 (1.651 m), weight 85.2 kg, SpO2 95%.  PHYSICAL EXAMINATION:   GENERAL:  58 y.o.-year-old patient with no acute distress.  LUNGS: Normal breath sounds bilaterally, no wheezing CARDIOVASCULAR: S1, S2 normal. No murmur   ABDOMEN: Soft, nontender, nondistended. Bowel sounds present.  EXTREMITIES: No  edema b/l.    NEUROLOGIC:  patient is alert and awake, left upper and lower extremity weakness with mild decrease sensation SKIN: No obvious rash, lesion, or ulcer.   LABORATORY PANEL:  CBC Recent Labs  Lab 02/05/24 1128  WBC 10.5  HGB 14.8  HCT 42.6  PLT 251    Chemistries  Recent Labs  Lab 02/05/24 0621 02/05/24 1128  NA 135  --   K 4.3  --   CL 100  --   CO2 26  --   GLUCOSE 339*  --   BUN 18  --   CREATININE 1.01 0.90  CALCIUM  9.1  --   AST 25  --   ALT 25  --   ALKPHOS 100  --   BILITOT 0.2  --    Cardiac Enzymes No results for input(s): TROPONINI in the last 168 hours. RADIOLOGY:   Assessment and Plan  Derek George is a 58 y.o. male with medical history significant of type II diabetes  comes to the emergency room accompanied by son with complaints of slurred speech and left-sided weakness upper and lower extremity.  Patient is Spanish-speaking. History is obtained with assistance of son  and brother. Patient said he went to bed around 7 PM woke around 3 AM complaining of left-sided numbness and difficulty speaking. His son spoke with him on the phone and he could not understand much went to the house brought him here to the emergency room.   Acute pontine infarct  -- patient came in with left upper and lower extremity weakness which slurred speech  -- patient symptoms improving. Not ITP candidate as determined by tele neuro- -- MRI brain results reviewed -- neurology recommends start aspirin  81 mg daily and Plavix  75 mg daily---and then continue aspirin  81 mg daily after 21 days -- continue statins -- PT OT to see patient-- recommends inpatient rehab.   Type II diabetes, uncontrolled -- diabetes coordinator, sliding scale insulin . Resume home meds  -- A1c 8.8    Hypertension -- will allow permissive hypertension for now -- patient takes amlodipine  at home  obesity -- diet and exercise discussed with patient  Discussed overall plan with patient and patient's son Derek George. TOC for discharge planning.  PT OT recommends IPR. Patient currently has no payer source. If family not able to pay privately then consider home health with charity      Procedures: Family communication :son Derek George Consults : neurology CODE STATUS: full DVT Prophylaxis : Lovenox  Level of care: Telemetry Status is: Inpatient  Remains inpatient appropriate because: acute CVA    TOTAL TIME TAKING CARE OF THIS PATIENT: 40 minutes.  >50% time spent on counselling and coordination of care  Note: This dictation was prepared with Dragon dictation along with smaller phrase technology. Any transcriptional errors that result from this process are unintentional.  Derek George M.D    [facility]   CC: Primary care physician; Patient, No Pcp Per

## 2024-02-06 NOTE — Progress Notes (Signed)
" ° °  Inpatient Rehab Admissions Coordinator :  Per therapy recommendations, patient was screened for CIR candidacy by Heron Leavell RN MSN.  At this time patient appears to be a potential candidate for CIR. I will place a rehab consult per protocol for full assessment. An Admissions Coordinator will make a full assessment  and discuss cost of care as uninsured per TOC.  Heron Leavell RN MSN Admissions Coordinator [phone]   "

## 2024-02-06 NOTE — Progress Notes (Signed)
 Physical Therapy Treatment Patient Details Name: Derek George MRN: [id number] DOB: [birth year] Today's Date: 02/06/2024   History of Present Illness Pt is a 58 y/o M admitted on 02/05/24 after presenting with c/o L sided numbness & changes in speech. MRI shows Patchy foci of restricted diffusion involving the anterior pons consistent  with acute infarction. PMH: DM2    PT Comments  Pt seen for PT tx with pt agreeable, family present, utilized iPad interpreter Derek George [phone]). Initiated gait training without AD with LUE support with pt requiring min<>mod assist. Provided pt with RW & pt ambulates with min assist. Pt notes BLE sensation (to light touch) & proprioception intact. Pt eager to participate. Recommend ongoing PT services to progress mobility & reduce fall risk.    If plan is discharge home, recommend the following: A little help with walking and/or transfers;A little help with bathing/dressing/bathroom;Assist for transportation;Assistance with cooking/housework;Help with stairs or ramp for entrance   Can travel by private vehicle        Equipment Recommendations  Rolling walker (2 wheels)    Recommendations for Other Services       Precautions / Restrictions Precautions Precautions: Fall Restrictions Weight Bearing Restrictions Per Provider Order: No     Mobility  Bed Mobility               General bed mobility comments: not tested, pt received & left sittingin recliner    Transfers Overall transfer level: Needs assistance Equipment used: None, Rolling walker (2 wheels) Transfers: Sit to/from Stand Sit to Stand: Contact guard assist           General transfer comment: education/cuing re: hand placement    Ambulation/Gait Ambulation/Gait assistance: Min assist, Mod assist Gait Distance (Feet): 40 Feet (+ 40 ft) Assistive device: Rollator (4 wheels), 1 person hand held assist Gait Pattern/deviations: Decreased step length - left, Decreased  dorsiflexion - left, Decreased stride length, Decreased weight shift to left Gait velocity: decreased     General Gait Details: Pt ambulates to door & back without AD with min<>mod assist 2/2 occasional LOB with mod assist to correct, LUE HHA. Provided pt with RW & pt ambulates with min assist, cuing re: positioning within base of AD.   Stairs             Wheelchair Mobility     Tilt Bed    Modified Rankin (Stroke Patients Only)       Balance Overall balance assessment: Needs assistance Sitting-balance support: Feet supported Sitting balance-Leahy Scale: Good     Standing balance support: During functional activity, No upper extremity supported Standing balance-Leahy Scale: Poor                              Communication Communication Factors Affecting Communication:  (Spanish speaking (iPad interpreter, [place] [phone]))  Cognition Arousal: Alert Behavior During Therapy: WFL for tasks assessed/performed   PT - Cognitive impairments: No apparent impairments                         Following commands: Impaired Following commands impaired: Follows multi-step commands with increased time    Cueing Cueing Techniques: Verbal cues  Exercises Other Exercises Other Exercises: Provided education on stroke recovery, importance of f/u therapy, provided pt with gait belt, educated them on RW use. Other Exercises: Provided family with information re: [facility].    General Comments  Pertinent Vitals/Pain Pain Assessment Pain Assessment: No/denies pain    Home Living Family/patient expects to be discharged to:: Private residence Living Arrangements: Spouse/significant other;Children Available Help at Discharge: Family Type of Home: House Home Access: Stairs to enter Entrance Stairs-Rails: Right;Left;Can reach both Secretary/administrator of Steps: 2-4   Home Layout: One level        Prior Function            PT Goals  (current goals can now be found in the care plan section) Acute Rehab PT Goals Patient Stated Goal: get better PT Goal Formulation: With patient/family Time For Goal Achievement: 02/19/24 Potential to Achieve Goals: Good Progress towards PT goals: Progressing toward goals    Frequency    Min 3X/week      PT Plan      Co-evaluation              AM-PAC PT 6 Clicks Mobility   Outcome Measure  Help needed turning from your back to your side while in a flat bed without using bedrails?: None Help needed moving from lying on your back to sitting on the side of a flat bed without using bedrails?: A Little Help needed moving to and from a bed to a chair (including a wheelchair)?: A Little Help needed standing up from a chair using your arms (e.g., wheelchair or bedside chair)?: A Little Help needed to walk in hospital room?: A Little Help needed climbing 3-5 steps with a railing? : A Little 6 Click Score: 19    End of Session   Activity Tolerance: Patient tolerated treatment well Patient left: in chair;with call bell/phone within reach;with family/visitor present   PT Visit Diagnosis: Other abnormalities of gait and mobility (R26.89);Muscle weakness (generalized) (M62.81);Unsteadiness on feet (R26.81);Difficulty in walking, not elsewhere classified (R26.2);Hemiplegia and hemiparesis Hemiplegia - Right/Left: Left Hemiplegia - dominant/non-dominant: Non-dominant Hemiplegia - caused by: Cerebral infarction     Time: 9069-9046 PT Time Calculation (min) (ACUTE ONLY): 23 min  Charges:    $Therapeutic Activity: 8-22 mins $Neuromuscular Re-education: 8-22 mins PT General Charges $$ ACUTE PT VISIT: 1 Visit                     [name], PT, DPT 02/06/2024, 10:04 AM   [name] 02/06/2024, 10:03 AM

## 2024-02-06 NOTE — Progress Notes (Signed)
*  PRELIMINARY RESULTS* Echocardiogram 2D Echocardiogram has been performed.  Floydene Harder 02/06/2024, 7:54 AM

## 2024-02-06 NOTE — Progress Notes (Addendum)
 Subjective: Patient with code stroke called on yesterday due to worsening left sided weakness.  Patient continues to be weak on the left.  Somewhat weaker than on his initial assessment.    Objective: Current vital signs: BP 121/73 (BP Location: Right Arm)   Pulse 64   Temp 98.4 F (36.9 C) (Oral)   Resp 18   SpO2 96%  Vital signs in last 24 hours: Temp:  [97.5 F (36.4 C)-98.4 F (36.9 C)] 98.4 F (36.9 C) (12/23 0344) Pulse Rate:  [64-86] 64 (12/23 0344) Resp:  [18-19] 18 (12/23 0344) BP: (121-182)/(73-148) 121/73 (12/23 0344) SpO2:  [94 %-99 %] 96 % (12/23 0344)  Intake/Output from previous day: 12/22 0701 - 12/23 0700 In: 81.8 [I.V.:81.8] Out: 550 [Urine:550] Intake/Output this shift: No intake/output data recorded. Nutritional status:  Diet Order             Diet heart healthy/carb modified Room service appropriate? Yes; Fluid consistency: Thin  Diet effective now                   Neurologic Exam: Mental Status: Alert, oriented, thought content appropriate.  Speech fluent without evidence of aphasia.  Able to follow 3 step commands without difficulty.  Dysarthric Cranial Nerves: II: Visual fields grossly normal III,IV, VI: ptosis not present, extra-ocular motions intact bilaterally V,VII: left facial droop, facial light touch sensation normal bilaterally VIII: hearing normal bilaterally XI: bilateral shoulder shrug XII: midline tongue extension Motor: Right :  Upper extremity   5/5                                      Left:     Upper extremity   4/5 with pronator drift             Lower extremity   5/5                                                  Lower extremity   4+/5 Tone and bulk:normal tone throughout; no atrophy noted Sensory: Pinprick and light touch intact throughout, bilaterally Deep Tendon Reflexes: 2+ and symmetric throughout Plantars: Right: upgoing                         Left: upgoing Cerebellar: normal finger-to-nose and normal  heel-to-shin testing bilaterally within limits of weakness   Lab Results: Basic Metabolic Panel: Recent Labs  Lab 02/05/24 0621 02/05/24 1128  NA 135  --   K 4.3  --   CL 100  --   CO2 26  --   GLUCOSE 339*  --   BUN 18  --   CREATININE 1.01 0.90  CALCIUM  9.1  --     Liver Function Tests: Recent Labs  Lab 02/05/24 0621  AST 25  ALT 25  ALKPHOS 100  BILITOT 0.2  PROT 7.1  ALBUMIN 4.0   No results for input(s): LIPASE, AMYLASE in the last 168 hours. No results for input(s): AMMONIA in the last 168 hours.  CBC: Recent Labs  Lab 02/05/24 0621 02/05/24 1128  WBC 8.5 10.5  NEUTROABS 6.6  --   HGB 14.2 14.8  HCT 41.5 42.6  MCV 89.1 87.3  PLT 248 251    Cardiac  Enzymes: No results for input(s): CKTOTAL, CKMB, CKMBINDEX, TROPONINI in the last 168 hours.  Lipid Panel: Recent Labs  Lab 02/06/24 0157  CHOL 190  TRIG 173*  HDL 31*  CHOLHDL 6.1  VLDL 35  LDLCALC 875*    CBG: Recent Labs  Lab 02/05/24 0633 02/05/24 0812 02/05/24 1101 02/05/24 1710 02/05/24 2000  GLUCAP 312* 257* 194* 140* 129*    Microbiology: Results for orders placed or performed during the hospital encounter of 11/07/18  SARS Coronavirus 2 [facility] order, Performed in [facility] hospital lab) Nasopharyngeal Nasopharyngeal Swab     Status: None   Collection Time: 11/07/18  1:14 AM   Specimen: Nasopharyngeal Swab  Result Value Ref Range Status   SARS Coronavirus 2 NEGATIVE NEGATIVE Final    Comment: (NOTE) If result is NEGATIVE SARS-CoV-2 target nucleic acids are NOT DETECTED. The SARS-CoV-2 RNA is generally detectable in upper and lower  respiratory specimens during the acute phase of infection. The lowest  concentration of SARS-CoV-2 viral copies this assay can detect is 250  copies / mL. A negative result does not preclude SARS-CoV-2 infection  and should not be used as the sole basis for treatment or other  patient management decisions.  A negative result  may occur with  improper specimen collection / handling, submission of specimen other  than nasopharyngeal swab, presence of viral mutation(s) within the  areas targeted by this assay, and inadequate number of viral copies  (<250 copies / mL). A negative result must be combined with clinical  observations, patient history, and epidemiological information. If result is POSITIVE SARS-CoV-2 target nucleic acids are DETECTED. The SARS-CoV-2 RNA is generally detectable in upper and lower  respiratory specimens dur ing the acute phase of infection.  Positive  results are indicative of active infection with SARS-CoV-2.  Clinical  correlation with patient history and other diagnostic information is  necessary to determine patient infection status.  Positive results do  not rule out bacterial infection or co-infection with other viruses. If result is PRESUMPTIVE POSTIVE SARS-CoV-2 nucleic acids MAY BE PRESENT.   A presumptive positive result was obtained on the submitted specimen  and confirmed on repeat testing.  While 2019 novel coronavirus  (SARS-CoV-2) nucleic acids may be present in the submitted sample  additional confirmatory testing may be necessary for epidemiological  and / or clinical management purposes  to differentiate between  SARS-CoV-2 and other Sarbecovirus currently known to infect humans.  If clinically indicated additional testing with an alternate test  methodology [phone]) is advised. The SARS-CoV-2 RNA is generally  detectable in upper and lower respiratory sp ecimens during the acute  phase of infection. The expected result is Negative. Fact Sheet for Patients:  boilerbrush.com.cy Fact Sheet for Healthcare Providers: https://pope.com/ This test is not yet approved or cleared by the [place]  FDA and has been authorized for detection and/or diagnosis of SARS-CoV-2 by FDA under an Emergency Use Authorization (EUA).   This EUA will remain in effect (meaning this test can be used) for the duration of the COVID-19 declaration under Section 564(b)(1) of the Act, 21 U.S.C. section 360bbb-3(b)(1), unless the authorization is terminated or revoked sooner. Performed at [facility], [address] [place], [place] [address]   Culture, blood (routine x 2)     Status: None   Collection Time: 11/08/18  2:33 PM   Specimen: BLOOD  Result Value Ref Range Status   Specimen Description BLOOD RIGHT ANTECUBITAL  Final   Special Requests  Final    Blood Culture results may not be optimal due to an inadequate volume of blood received in culture bottles   Culture   Final    NO GROWTH 5 DAYS Performed at [facility], [address] [place], [place] [address]    Report Status 11/13/2018 FINAL  Final  Culture, blood (routine x 2)     Status: None   Collection Time: 11/08/18  3:39 PM   Specimen: BLOOD  Result Value Ref Range Status   Specimen Description BLOOD BLOOD RIGHT FOREARM  Final   Special Requests Blood Culture adequate volume  Final   Culture   Final    NO GROWTH 5 DAYS Performed at [facility], [address], [place], [place] [address]    Report Status 11/13/2018 FINAL  Final    Coagulation Studies: Recent Labs    02/05/24 0621  LABPROT 12.7  INR 0.9    Imaging: CT ANGIO HEAD NECK W WO CM (CODE STROKE) Result Date: 02/05/2024 EXAM: CTA Head and Neck with Intravenous Contrast. CT Head without Contrast. CLINICAL HISTORY: Stroke alert, rule out LVO. TECHNIQUE: Axial CTA images of the head and neck performed with intravenous contrast. MIP reconstructed images were created and reviewed. Axial computed tomography images of the head/brain performed without intravenous contrast. Note: Per PQRS, the description of internal carotid artery percent stenosis, including 0 percent or normal exam, is based on [employer] American Symptomatic Carotid Endarterectomy Trial (NASCET)  criteria. Dose reduction technique was used including one or more of the following: automated exposure control, adjustment of mA and kV according to patient size, and/or iterative reconstruction. CONTRAST: With; COMPARISON: None provided. FINDINGS: CT HEAD: BRAIN: No acute intraparenchymal hemorrhage. No mass lesion. No CT evidence for acute territorial infarct. No midline shift or extra-axial collection. VENTRICLES: No hydrocephalus. ORBITS: The orbits are unremarkable. SINUSES AND MASTOIDS: Moderate right maxillary sinus mucosal thickening. CTA NECK: COMMON CAROTID ARTERIES: No significant stenosis. No dissection or occlusion. INTERNAL CAROTID ARTERIES: No stenosis by NASCET criteria. No dissection or occlusion. VERTEBRAL ARTERIES: Severe right vertebral origin stenosis. No dissection or occlusion. CTA HEAD: ANTERIOR CEREBRAL ARTERIES: Severe bilateral A3 ACA stenosis. No occlusion. No aneurysm. MIDDLE CEREBRAL ARTERIES: Moderate distal left M1 MCA stenosis. No occlusion. No aneurysm. POSTERIOR CEREBRAL ARTERIES: Severe right P1 and left P2 PCA stenosis. No occlusion. No aneurysm. BASILAR ARTERY: No significant stenosis. No occlusion. No aneurysm. OTHER: SOFT TISSUES: No acute finding. No masses or lymphadenopathy. BONES: No acute osseous abnormality. IMPRESSION: 1. No evidence of acute intracranial abnormality. ASPECTS 10. 2. No large vessel occlusion. 3. 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 4. Severe bilateral A3 ACA stenosis. 5. Severe right vertebral origin stenosis. 6. Moderate distal left M1 MCA stenosis. Electronically signed by: Gilmore Molt 02/05/2024 09:29 PM EST RP Workstation: [id number]   MR BRAIN WO CONTRAST Result Date: 02/05/2024 EXAM: MRI BRAIN WITHOUT CONTRAST 02/05/2024 12:36:07 PM TECHNIQUE: Multiplanar multisequence MRI of the head/brain was performed without the administration of intravenous contrast. COMPARISON: CT Head 02/05/2024. CLINICAL HISTORY: Stroke, follow up. Change in  speech and left sided numbness. FINDINGS: BRAIN AND VENTRICLES: Patchy foci of restricted diffusion involving the bilateral anterior pons with associated T2/FLAIR hyperintensity. Mild to moderate subcortical and periventricular T2 and FLAIR signal hyperintensity likely reflecting sequelae of chronic microvascular ischemia. 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 A couple small foci of susceptibility within the left parietal/ occipital lobe which may relate to the sequelae of prior micro  hemorrhage. No mass. No midline shift. No hydrocephalus. The sella is unremarkable. Normal flow voids. ORBITS: Bilateral lens replacement. SINUSES AND MASTOIDS: Polypoid mucosal thickening right maxillary sinus. Trace left mastoid effusion. BONES AND SOFT TISSUES: Normal marrow signal. No acute soft tissue abnormality. IMPRESSION: 1. Patchy foci of restricted diffusion involving the anterior pons consistent with acute infarction. 2. No acute intracranial hemorrhage. Electronically signed by: Prentice Spade MD 02/05/2024 03:39 PM EST RP Workstation: [id number]   DG Chest Portable 1 View Result Date: 02/05/2024 CLINICAL DATA:  Shortness of breath.  Code stroke. EXAM: PORTABLE CHEST 1 VIEW COMPARISON:  None Available. FINDINGS: Low volume film. The lungs are clear without focal pneumonia, edema, pneumothorax or pleural effusion. Cardiopericardial silhouette is at upper limits of normal for size. No acute bony abnormality. Telemetry leads overlie the chest. IMPRESSION: Low volume film without acute cardiopulmonary findings. Electronically Signed   By: Camellia Candle M.D.   On: 02/05/2024 07:19   CT ANGIO HEAD NECK W WO CM W PERF (CODE STROKE) Result Date: 02/05/2024 EXAM: CTA Head and Neck with Perfusion 02/05/2024 07:00:23 AM TECHNIQUE: CTA of the head and neck was performed without and with the administration of intravenous contrast. 3D postprocessing with multiplanar  reconstructions and MIPs was performed to evaluate the vascular anatomy. Cerebral perfusion analysis using computed tomography with contrast administration, including post-processing of parametric maps with determination of cerebral blood flow, cerebral blood volume, mean transit time and time-to-maximum. Automated exposure control, iterative reconstruction, and/or weight based adjustment of the mA/kV was utilized to reduce the radiation dose to as low as reasonably achievable. COMPARISON: CT of the head dated 02/05/2024 CLINICAL HISTORY: Neuro deficit, acute, stroke suspected. FINDINGS: CTA NECK: AORTIC ARCH AND ARCH VESSELS: Mild calcific plaque within the aortic arch. No dissection or arterial injury. No significant stenosis of the brachiocephalic or subclavian arteries. CERVICAL CAROTID ARTERIES: No dissection, arterial injury, or hemodynamically significant stenosis by NASCET criteria. CERVICAL VERTEBRAL ARTERIES: No dissection, arterial injury, or significant stenosis. LUNGS AND MEDIASTINUM: Unremarkable. SOFT TISSUES: No acute abnormality. BONES: No acute abnormality. CTA HEAD: ANTERIOR CIRCULATION: No significant stenosis of the internal carotid arteries. No significant stenosis of the anterior cerebral arteries. No significant stenosis of the middle cerebral arteries. No aneurysm. POSTERIOR CIRCULATION: No significant stenosis of the posterior cerebral arteries. No significant stenosis of the basilar artery. No significant stenosis of the vertebral arteries. No aneurysm. OTHER: No dural venous sinus thrombosis on this non-dedicated study. The above findings were discussed with Dr. Willo at 07:09 am 02/05/2024. CT PERFUSION: EXAM QUALITY: Exam quality is adequate with diagnostic perfusion maps. No significant motion artifact. Appropriate arterial inflow and venous outflow curves. CORE INFARCT (CBF<30% volume): 0 mL TOTAL HYPOPERFUSION (Tmax>6s volume): 0 mL PENUMBRA: Mismatch volume: 0 mL Mismatch ratio:  not applicable Location: not applicable IMPRESSION: 1. No acute large vessel occlusion. 2. No hemodynamically significant stenosis or aneurysm in the head or neck vessels. Mild calcific plaque within the aortic arch. 3. No evidence of ischemia by CT brain perfusion. 4. Findings discussed with Dr. Willo at 07:09 AM on 02/05/2024. Electronically signed by: Evalene Coho MD 02/05/2024 07:11 AM EST RP Workstation: [id number]   CT HEAD CODE STROKE WO CONTRAST Addendum Date: 02/05/2024  ADDENDUM #1  ADDENDUM: Aspects 10. Findings discussed with Dr. Willo 6:37 AM 02/05/24. ---------------------------------------------------- Electronically signed by: Evalene Coho MD 02/05/2024 06:37 AM EST RP Workstation: [id number]   Result Date: 02/05/2024  ORIGINAL REPORT  EXAM: CT HEAD WITHOUT CONTRAST 02/05/2024 06:29:40 AM TECHNIQUE: CT of the  head was performed without the administration of intravenous contrast. Automated exposure control, iterative reconstruction, and/or weight based adjustment of the mA/kV was utilized to reduce the radiation dose to as low as reasonably achievable. COMPARISON: None available. CLINICAL HISTORY: Neuro deficit, acute, stroke suspected. FINDINGS: BRAIN AND VENTRICLES: No acute hemorrhage. No evidence of acute infarct. No hydrocephalus. No extra-axial collection. No mass effect or midline shift. ORBITS: Bilateral lens replacement. SINUSES: Polypoid mucosal thickening in right maxillary sinus. SOFT TISSUES AND SKULL: No acute soft tissue abnormality. No skull fracture. IMPRESSION: 1. No acute intracranial abnormality. 2. Polypoid mucosal thickening in the right maxillary sinus. Electronically signed by: Evalene Coho MD 02/05/2024 06:32 AM EST RP Workstation: [id number]    Medications: I have reviewed the patient's current medications. Scheduled:   stroke: early stages of recovery book   Does not apply Once   aspirin  EC  81 mg Oral Daily   clopidogrel   75 mg Oral Daily    enoxaparin  (LOVENOX ) injection  40 mg Subcutaneous Q24H   insulin  aspart  0-5 Units Subcutaneous QHS   insulin  aspart  0-9 Units Subcutaneous TID WC   linagliptin   5 mg Oral Daily   rosuvastatin   20 mg Oral QHS   sodium chloride  flush  3 mL Intravenous Once    Assessment/Plan: 58 y.o. male with a history of DM who presented with complaints of left sided weakness and dysarthria.  Some worsening since yesterday's evaluation.  CT/CTA of the head and neck performed in follow up and personally reviewed.  No evidence of hemorrhage.  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  Moderate distal left M1 stenosis noted as well.  Worsening represents evolution of current infarct.   A1c 8.8, LDL 124.     Stroke risk factors: DM   1. Blood sugar management.  Goal A1c<7.0. 2. Would increase Crestor  3. Continue therapy 4. Echocardiogram pending 5. Prophylactic therapy-Dual antiplatelet therapy with ASA 81mg  and Plavix  75mg  for three weeks with change to ASA 81mg  alone as monotherapy after that time.  6. Permissive BP management with no treatment of SBP<200 for the first 24-48 hours.  Goal BP after that time <140/80  7. NPO until RN stroke swallow screen 8. Telemetry monitoring 9. Frequent neuro checks   Case discussed with Dr. Tobie   LOS: 0 days   Sonny Hock, MD Neurology  02/06/2024  7:26 AM

## 2024-02-06 NOTE — Plan of Care (Signed)
  Problem: Education: Goal: Ability to describe self-care measures that may prevent or decrease complications (Diabetes Survival Skills Education) will improve Outcome: Progressing   Problem: Coping: Goal: Ability to adjust to condition or change in health will improve Outcome: Progressing   Problem: Skin Integrity: Goal: Risk for impaired skin integrity will decrease Outcome: Progressing   Problem: Education: Goal: Knowledge of General Education information will improve Description: Including pain rating scale, medication(s)/side effects and non-pharmacologic comfort measures Outcome: Progressing

## 2024-02-06 NOTE — Evaluation (Addendum)
 Occupational Therapy Evaluation Patient Details Name: Derek George MRN: [id number] DOB: 1965-06-02 Today's Date: 02/06/2024   History of Present Illness   Pt is a 58 y/o M admitted on 02/05/24 after presenting with c/o L sided numbness & changes in speech. MRI shows Patchy foci of restricted diffusion involving the anterior pons consistent  with acute infarction. PMH: DM2     Clinical Impressions Pt was seen for OT evaluation this date. PTA, he is fully independent, working and driving. Pt presents with deficits in strength, balance, coordination, and sensation limiting their ability to perform ADL management at baseline level. Pt is supine in bed on arrival. [employer] interpreter Norman [phone] during session. He is pleasant and agreeable to OT session. He denies pain. Pt performed bed mobility with CGA, increased time/effort. LUE ROM intact, although LUE/LLE weakness noted, 4- to 3+/5 in L shoulder and 3 to 3+/5 in L hand. LUE GMC/FMC deficits noted with inability to perform opposition and pt becoming very emotional about this with encouragement and active listening provided. Sensation intact to light touch, but still feels off. He is able to hold toothbrush in L hand for stabilization and turn off water using L hand. Min A for STS from EOB to RW with cues for hand placement and Min/Mod A for ambulation ~20 ft using RW with cues for RW proximity and LLE awareness for larger steps. Pt uses vision to compensate and maximize safety during ambulation. Min/mod A required for clothing management in standing after toileting. He would be a great IPR candidate as he is motivated and currently far from his baseline function in regards to ADL function and mobility. Family present and supportive at bedside. Will follow acutely to promote return to PLOF.    If plan is discharge home, recommend the following:   A little help with walking and/or transfers;A lot of help with  bathing/dressing/bathroom;A little help with bathing/dressing/bathroom;Help with stairs or ramp for entrance;Assistance with cooking/housework;Assist for transportation     Functional Status Assessment   Patient has had a recent decline in their functional status and demonstrates the ability to make significant improvements in function in a reasonable and predictable amount of time.     Equipment Recommendations   Other (comment) (defer to next venue)     Recommendations for Other Services   Rehab consult     Precautions/Restrictions   Precautions Precautions: Fall Restrictions Weight Bearing Restrictions Per Provider Order: No     Mobility Bed Mobility Overal bed mobility: Needs Assistance Bed Mobility: Supine to Sit     Supine to sit: Contact guard, HOB elevated, Used rails     General bed mobility comments: increased time and effort required to exit R side of bed with cues for technique and hand placement    Transfers Overall transfer level: Needs assistance Equipment used: Rolling walker (2 wheels) Transfers: Sit to/from Stand Sit to Stand: Min assist           General transfer comment: increased time and cues for hand placement to push up from EOB to RW; LLE dragging during ambulation, with cues able to clear hwoever became more exaggerated, decreased proprioception noted      Balance Overall balance assessment: Needs assistance Sitting-balance support: Feet supported, No upper extremity supported Sitting balance-Leahy Scale: Good     Standing balance support: Reliant on assistive device for balance, Bilateral upper extremity supported Standing balance-Leahy Scale: Poor Standing balance comment: +1 external support and RW use to maintain balance  ADL either performed or assessed with clinical judgement   ADL Overall ADL's : Needs assistance/impaired     Grooming: Wash/dry hands;Oral care;Standing;Contact  guard assist                   Toilet Transfer: Minimal assistance;Rolling walker (2 wheels);Regular Toilet;Grab bars   Toileting- Clothing Manipulation and Hygiene: Minimal assistance;Sit to/from stand Toileting - Clothing Manipulation Details (indicate cue type and reason): to manage underwear     Functional mobility during ADLs: Moderate assistance;Minimal assistance;Rolling walker (2 wheels);Cueing for safety       Vision Patient Visual Report: No change from baseline       Perception         Praxis         Pertinent Vitals/Pain Pain Assessment Pain Assessment: No/denies pain Pain Intervention(s): Monitored during session     Extremity/Trunk Assessment Upper Extremity Assessment Upper Extremity Assessment: LUE deficits/detail (reports use of bil hands, feeds self with L hand) LUE Deficits / Details: 4-/5 shoulder strength, 3/5 grip strength LUE Coordination: decreased fine motor;decreased gross motor   Lower Extremity Assessment Lower Extremity Assessment: LLE deficits/detail;Defer to PT evaluation LLE Deficits / Details: notable weakness LLE Sensation: decreased proprioception LLE Coordination: decreased fine motor;decreased gross motor       Communication Communication Factors Affecting Communication:  (utilized ipad interpreter eduardo [phone])   Cognition Arousal: Alert Behavior During Therapy: WFL for tasks assessed/performed (becomes anxious with chest pain)               OT - Cognition Comments: very emotional after LUE/FMC testing with notable deficits                 Following commands: Impaired Following commands impaired: Follows multi-step commands with increased time     Cueing  General Comments   Cueing Techniques: Verbal cues      Exercises Other Exercises Other Exercises: Edu on role of OT in acute setting, increasing use of LUE during all functional tasks and DC recommendations discussed with family.   Shoulder  Instructions      Home Living Family/patient expects to be discharged to:: Private residence Living Arrangements: Spouse/significant other;Children Available Help at Discharge: Family Type of Home: House Home Access: Stairs to enter Secretary/administrator of Steps: 2-4 Entrance Stairs-Rails: Right;Left;Can reach both Home Layout: One level                          Prior Functioning/Environment Prior Level of Function : Independent/Modified Independent;Working/employed;Driving                    OT Problem List: Decreased strength;Decreased coordination;Impaired balance (sitting and/or standing);Impaired sensation   OT Treatment/Interventions: Self-care/ADL training;Therapeutic exercise;Patient/family education;Balance training;Neuromuscular education;Energy conservation;Therapeutic activities;DME and/or AE instruction      OT Goals(Current goals can be found in the care plan section)   Acute Rehab OT Goals Patient Stated Goal: get better OT Goal Formulation: With patient/family Time For Goal Achievement: 02/20/24 Potential to Achieve Goals: Good ADL Goals Pt Will Perform Lower Body Dressing: with supervision;sit to/from stand;sitting/lateral leans Pt Will Transfer to Toilet: with supervision;ambulating Pt Will Perform Toileting - Clothing Manipulation and hygiene: with supervision;sitting/lateral leans;sit to/from stand Pt/caregiver will Perform Home Exercise Program: Increased strength;Left upper extremity;With theraputty;With Supervision;With written HEP provided;With theraband   OT Frequency:  Min 3X/week    Co-evaluation              AM-PAC OT 6  Clicks Daily Activity     Outcome Measure Help from another person eating meals?: A Little Help from another person taking care of personal grooming?: A Little Help from another person toileting, which includes using toliet, bedpan, or urinal?: A Little Help from another person bathing (including  washing, rinsing, drying)?: A Lot Help from another person to put on and taking off regular upper body clothing?: A Little Help from another person to put on and taking off regular lower body clothing?: A Lot 6 Click Score: 16   End of Session Equipment Utilized During Treatment: Rolling walker (2 wheels) Nurse Communication: Mobility status  Activity Tolerance: Patient tolerated treatment well Patient left: in chair;with call bell/phone within reach;with chair alarm set  OT Visit Diagnosis: Other abnormalities of gait and mobility (R26.89);Unsteadiness on feet (R26.81);Muscle weakness (generalized) (M62.81);Hemiplegia and hemiparesis Hemiplegia - Right/Left: Left Hemiplegia - caused by: Cerebral infarction                Time: 9165-9094 OT Time Calculation (min): 31 min Charges:  OT General Charges $OT Visit: 1 Visit OT Evaluation $OT Eval Moderate Complexity: 1 Mod OT Treatments $Self Care/Home Management : 8-22 mins  Andromeda Poppen Chrismon, OTR/L  02/06/2024, 10:04 AM   Davin Archuletta E Chrismon 02/06/2024, 9:53 AM

## 2024-02-06 NOTE — Evaluation (Signed)
 Speech Language Pathology Evaluation Patient Details Name: Derek George MRN: [id number] DOB: 02-06-1966 Today's Date: 02/06/2024 Time: 0950-1006 SLP Time Calculation (min) (ACUTE ONLY): 16 min  Problem List:  Patient Active Problem List   Diagnosis Date Noted   Left-sided weakness 02/05/2024   Cholecystitis 11/07/2018   Past Medical History:  Past Medical History:  Diagnosis Date   Diabetes mellitus without complication (HCC)    Past Surgical History:  Past Surgical History:  Procedure Laterality Date   APPENDECTOMY     CHOLECYSTECTOMY N/A 11/07/2018   Procedure: LAPAROSCOPIC CHOLECYSTECTOMY WITH INTRAOPERATIVE CHOLANGIOGRAM;  Surgeon: Jordis Laneta FALCON, MD;  Location: [facility] ORS;  Service: General;  Laterality: N/A;   ERCP N/A 11/09/2018   Procedure: ENDOSCOPIC RETROGRADE CHOLANGIOPANCREATOGRAPHY (ERCP);  Surgeon: Jinny Carmine, MD;  Location: [facility] ENDOSCOPY;  Service: Endoscopy;  Laterality: N/A;   HPI:  Pt is a 58 y/o M admitted on 02/05/24 after presenting with c/o L sided numbness & changes in speech. MRI shows Patchy foci of restricted diffusion involving the anterior pons consistent  with acute infarction. PMH: DM2   Assessment / Plan / Recommendation Clinical Impression  Pt seen for cognitive-communication evaluation. Pt seen upright in recliner. Flat affect. Family present. Videointerpreter, Aime [phone]), utilized for duration of evaluation. Assessment today completed via informal means. Pt's basic primary language ability and basic functional cognitive-linguistic ability are judged to be [facility]. Pt with mild dysarthria with auditory perceptual characteristics of imprecise articulation which is exacerbated by intermittent reduced vocal loudness and intermittent fast rate of speech. Pt's speech is judged to be >85% intelligible to unfamiliar listener. Noted L orofacial weakness on oral motor examination. Pt educated re: communication strategies including slow rate, increased  loudness, and overarticulation. Pt stimulable to above mentioned strategies with direct clinician modeling and returned demonstration. Recommend f/u ST for dysarthria.    SLP Assessment  SLP Recommendation/Assessment: Patient needs continued Speech Language Pathology Services SLP Visit Diagnosis: Dysarthria and anarthria (R47.1)     Assistance Recommended at Discharge   (defer to OT/PT)  Functional Status Assessment Patient has had a recent decline in their functional status and demonstrates the ability to make significant improvements in function in a reasonable and predictable amount of time.  Frequency and Duration min 2x/week  2 weeks      SLP Evaluation Cognition  Overall Cognitive Status: Within Functional Limits for tasks assessed Arousal/Alertness: Awake/alert Orientation Level: Oriented X4       Comprehension  Auditory Comprehension Overall Auditory Comprehension: Appears within functional limits for tasks assessed    Expression Expression Primary Mode of Expression: Verbal Verbal Expression Overall Verbal Expression: Impaired Initiation: No impairment Automatic Speech: Name;Social Response (WFL) Level of Generative/Spontaneous Verbalization: Phrase;Sentence Repetition: No impairment Naming: No impairment Pragmatics: Impairment Impairments: Abnormal affect (flat affect) Interfering Components: Speech intelligibility Written Expression Written Expression: Not tested   Oral / Motor  Oral Motor/Sensory Function Overall Oral Motor/Sensory Function: Mild impairment Facial ROM: Reduced left Facial Symmetry: Abnormal symmetry right Lingual ROM: Reduced left Motor Speech Overall Motor Speech: Impaired Respiration: Within functional limits Phonation: Normal Resonance: Within functional limits Articulation: Impaired Level of Impairment:  (all levels) Intelligibility:  (>85% intelligible to unfamiliar listener) Motor Planning: Within functional limits Effective  Techniques: Slow rate;Increased vocal intensity;Over-articulate           Delon Bangs, M.S., CCC-SLP Speech-Language Pathologist [facility] [phone] (ASCOM)  [name] 02/06/2024, 10:23 AM

## 2024-02-07 ENCOUNTER — Other Ambulatory Visit: Payer: Self-pay

## 2024-02-07 DIAGNOSIS — R4701 Aphasia: Secondary | ICD-10-CM

## 2024-02-07 LAB — GLUCOSE, CAPILLARY: Glucose-Capillary: 129 mg/dL — ABNORMAL HIGH (ref 70–99)

## 2024-02-07 MED ORDER — CLOPIDOGREL BISULFATE 75 MG PO TABS
75.0000 mg | ORAL_TABLET | Freq: Every day | ORAL | 0 refills | Status: AC
  Filled 2024-02-07: qty 21, 21d supply, fill #0

## 2024-02-07 MED ORDER — AMLODIPINE BESYLATE 5 MG PO TABS
5.0000 mg | ORAL_TABLET | Freq: Every day | ORAL | Status: DC
  Administered 2024-02-07: 5 mg via ORAL
  Filled 2024-02-07: qty 1

## 2024-02-07 MED ORDER — TRADJENTA 5 MG PO TABS
5.0000 mg | ORAL_TABLET | Freq: Every day | ORAL | 2 refills | Status: AC
  Filled 2024-02-07: qty 90, 90d supply, fill #0

## 2024-02-07 MED ORDER — AMLODIPINE BESYLATE 5 MG PO TABS
5.0000 mg | ORAL_TABLET | Freq: Every day | ORAL | 2 refills | Status: AC
  Filled 2024-02-07: qty 90, 90d supply, fill #0

## 2024-02-07 MED ORDER — ASPIRIN 81 MG PO TBEC
81.0000 mg | DELAYED_RELEASE_TABLET | Freq: Every day | ORAL | 12 refills | Status: AC
  Filled 2024-02-07: qty 90, 90d supply, fill #0

## 2024-02-07 MED ORDER — GLIPIZIDE 10 MG PO TABS
10.0000 mg | ORAL_TABLET | Freq: Two times a day (BID) | ORAL | 2 refills | Status: AC
  Filled 2024-02-07: qty 180, 90d supply, fill #0

## 2024-02-07 MED ORDER — ROSUVASTATIN CALCIUM 40 MG PO TABS
40.0000 mg | ORAL_TABLET | Freq: Every day | ORAL | 2 refills | Status: AC
  Filled 2024-02-07: qty 90, 90d supply, fill #0

## 2024-02-07 MED ORDER — EZETIMIBE 10 MG PO TABS
10.0000 mg | ORAL_TABLET | Freq: Every day | ORAL | 2 refills | Status: AC
  Filled 2024-02-07: qty 90, 90d supply, fill #0

## 2024-02-07 NOTE — TOC Progression Note (Signed)
 Transition of Care [facility]) - Progression Note    Patient Details  Name: Derek George MRN: [id number] Date of Birth: 1965-07-08  [facility] of [facility]) CM/SW Contact  Daved JONETTA Hamilton, RN Phone Number: 02/07/2024, 9:45 AM  Clinical Narrative:     This CM sent referral to [facility] in the HUB, notified Larraine with [facility] of referral and patient discharge today, Larraine advised they can see patient on Monday.  This CM notified Mitch with [employer] of orders for RW and [facility], requested delivery to room for discharge today.                     Expected Discharge Plan and Services                                               Social Drivers of Health (SDOH) Interventions SDOH Screenings   Food Insecurity: Patient Declined (02/05/2024)  Housing: Patient Declined (02/05/2024)  Transportation Needs: Patient Declined (02/05/2024)  Utilities: Patient Declined (02/05/2024)  Social Connections: Unknown (02/05/2024)  Tobacco Use: Low Risk (02/06/2024)    Readmission Risk Interventions     No data to display

## 2024-02-07 NOTE — Progress Notes (Signed)
 Physical Therapy Treatment Patient Details Name: Derek George MRN: [id number] DOB: 09/12/1965 Today's Date: 02/07/2024   History of Present Illness Pt is a 58 y/o M admitted on 02/05/24 after presenting with c/o L sided numbness & changes in speech. MRI shows Patchy foci of restricted diffusion involving the anterior pons consistent  with acute infarction. PMH: DM2    PT Comments  Pt received in bed, son present to interpret. Unfortunately pt is unable to go to CIR and will be d/c home with family. Therefore much education and handouts in Spanish reviewed and given to pt and son with explanation regarding, B LE strengthening exercises, specific deficits from recent stroke, and safe mobility with RW and stairs. All questions and concerns addressed. Pt to receive HHPT. Will continue acute PT per POC.   If plan is discharge home, recommend the following: A little help with walking and/or transfers;A little help with bathing/dressing/bathroom;Assist for transportation;Assistance with cooking/housework;Help with stairs or ramp for entrance   Can travel by private vehicle        Equipment Recommendations  Rolling walker (2 wheels)    Recommendations for Other Services       Precautions / Restrictions Precautions Precautions: Fall Recall of Precautions/Restrictions: Intact Restrictions Weight Bearing Restrictions Per Provider Order: No     Mobility  Bed Mobility Overal bed mobility: Needs Assistance Bed Mobility: Supine to Sit     Supine to sit: Contact guard, HOB elevated, Used rails     General bed mobility comments:  (Son able to assist with good tolerance)    Transfers Overall transfer level: Needs assistance Equipment used: Rolling walker (2 wheels) Transfers: Sit to/from Stand Sit to Stand: Contact guard assist           General transfer comment: education/cuing re: hand placement    Ambulation/Gait Ambulation/Gait assistance: Min assist Gait Distance  (Feet): 60 Feet Assistive device: Rolling walker (2 wheels) Gait Pattern/deviations: Decreased step length - left, Decreased dorsiflexion - left, Decreased stride length, Decreased weight shift to left Gait velocity: decreased     General Gait Details: Provided pt with RW & pt ambulates with min assist, cuing re: positioning within base of AD.   Stairs Stairs:  (Discussed and educated pt and son on proper technique to negotiate stairs)           Wheelchair Mobility     Tilt Bed    Modified Rankin (Stroke Patients Only)       Balance Overall balance assessment: Needs assistance Sitting-balance support: Feet supported Sitting balance-Leahy Scale: Good     Standing balance support: Bilateral upper extremity supported, During functional activity, Reliant on assistive device for balance Standing balance-Leahy Scale: Poor Standing balance comment:  (High fall risk. Family issued gait belt and educated on not letting pt ambulate unassisted)                            Communication Communication Communication: Impaired Factors Affecting Communication: Non - English speaking, interpreter not available;Other (comment) (Son to interpret)  Cognition Arousal: Alert Behavior During Therapy: WFL for tasks assessed/performed   PT - Cognitive impairments: No apparent impairments                         Following commands: Impaired Following commands impaired: Follows multi-step commands with increased time    Cueing Cueing Techniques: Verbal cues  Exercises Other Exercises Other Exercises: RW adjusted to proper height  for home use. Educated on various uses of BSC    General Comments General comments (skin integrity, edema, etc.): Pt and son given handouts in spanish for B LE therex, Info on pt's particular stroke and expectations      Pertinent Vitals/Pain Pain Assessment Pain Assessment: No/denies pain    Home Living                           Prior Function            PT Goals (current goals can now be found in the care plan section) Acute Rehab PT Goals Patient Stated Goal: get better Progress towards PT goals: Progressing toward goals    Frequency    Min 3X/week      PT Plan      Co-evaluation              AM-PAC PT 6 Clicks Mobility   Outcome Measure  Help needed turning from your back to your side while in a flat bed without using bedrails?: A Little Help needed moving from lying on your back to sitting on the side of a flat bed without using bedrails?: A Little Help needed moving to and from a bed to a chair (including a wheelchair)?: A Little Help needed standing up from a chair using your arms (e.g., wheelchair or bedside chair)?: A Little Help needed to walk in hospital room?: A Little Help needed climbing 3-5 steps with a railing? : A Little 6 Click Score: 18    End of Session Equipment Utilized During Treatment: Gait belt Activity Tolerance: Patient tolerated treatment well Patient left: in chair;with call bell/phone within reach;with family/visitor present Nurse Communication: Mobility status PT Visit Diagnosis: Other abnormalities of gait and mobility (R26.89);Muscle weakness (generalized) (M62.81);Unsteadiness on feet (R26.81);Difficulty in walking, not elsewhere classified (R26.2);Hemiplegia and hemiparesis Hemiplegia - Right/Left: Left Hemiplegia - dominant/non-dominant: Non-dominant Hemiplegia - caused by: Cerebral infarction     Time: 1031-1100 PT Time Calculation (min) (ACUTE ONLY): 29 min  Charges:    $Gait Training: 8-22 mins $Therapeutic Activity: 8-22 mins PT General Charges $$ ACUTE PT VISIT: 1 Visit                    Darice Bohr, PTA  Darice JAYSON Bohr 02/07/2024, 11:43 AM

## 2024-02-07 NOTE — Progress Notes (Signed)
 [facility] IP rehab admissions - I spoke with patient's son, Jenna, by phone.  I explained inpatient rehab and cost of program.  Patient is uninsured.  Son called his family and they have chosen not to come to [facility] CIR but to discharge home with [facility] and charity care.  [phone]

## 2024-02-07 NOTE — Discharge Instructions (Addendum)
 Keep log of sugars at home and review/discuss with PCP at [facility]  You are encouraged to [facility] and arrange an application appointment to be screened for service to get set up with a physician for primary care  You may print the application and take with you to the appointment. At this web address [email or web address] Please Call [facility] for appointment  [phone]  [address] [place], [place] [address]  Office Hours Monday: Closed Tuesday: 9:00am - 4:00pm Wednesday: 9:00am - 4:00pm Thursday: 9:00am - 8:00pm Friday: Closed  Endocrinology 2nd Thursday of the Month: 5:00pm - 8:00pm  Rheumatology/Orthopedic Clinic By appointment only.  Contact for availability.     Services Not Covered Obstetrics Gastrointestinal/Liver Disease   [facility] [address] [place], [place] [address] Phone number [phone]  [facility] will contact you to set up initial appointment within 24-48 hours after discharge.

## 2024-02-07 NOTE — Progress Notes (Signed)
 Occupational Therapy Treatment Patient Details Name: Derek George MRN: [id number] DOB: 1965/06/19 Today's Date: 02/07/2024   History of present illness Pt is a 58 y/o M admitted on 02/05/24 after presenting with c/o L sided numbness & changes in speech. MRI shows Patchy foci of restricted diffusion involving the anterior pons consistent  with acute infarction. PMH: DM2   OT comments  Pt is seated on EOB after working with PT on arrival. Pleasant and agreeable to OT session. Son present to interpret. Provided spanish written HEP for LUE to maximize ROM, strength and FMC/GMC. Edu on incorporating LUE use during all safe functional tasks to promote improved functional use. Max A required for LB dressing during session d/t difficulty gripping with L hand to manage pants, edu on dressing LLE first. Pt required CGA for STS and Min A for SPT from bed>recliner using RW with cues for LUE placement on RW. Continues LUE weakness, coordination deficits and decreased functional use/grip in L hand. Pt demo back all exercises on handout with son present and verbalizing understanding of all HEP. Pt left in recliner with all needs in place and will cont to require skilled acute OT services to maximize his safety and IND to return to PLOF.       If plan is discharge home, recommend the following:  A little help with walking and/or transfers;A lot of help with bathing/dressing/bathroom;A little help with bathing/dressing/bathroom;Help with stairs or ramp for entrance;Assistance with cooking/housework;Assist for transportation   Equipment Recommendations  BSC/3in1;Other (comment) (RW)    Recommendations for Other Services      Precautions / Restrictions Precautions Precautions: Fall Recall of Precautions/Restrictions: Intact Restrictions Weight Bearing Restrictions Per Provider Order: No       Mobility Bed Mobility               General bed mobility comments: deferred seated EOB on entry     Transfers Overall transfer level: Needs assistance Equipment used: Rolling walker (2 wheels) Transfers: Sit to/from Stand Sit to Stand: Contact guard assist           General transfer comment: cueing for hand placement and safety with LUE use on RW     Balance Overall balance assessment: Needs assistance Sitting-balance support: Feet supported Sitting balance-Leahy Scale: Good     Standing balance support: Bilateral upper extremity supported, During functional activity, Reliant on assistive device for balance Standing balance-Leahy Scale: Poor Standing balance comment: RW and +1 external support                           ADL either performed or assessed with clinical judgement   ADL Overall ADL's : Needs assistance/impaired                     Lower Body Dressing: Maximal assistance;Moderate assistance;Sit to/from stand;Sitting/lateral leans Lower Body Dressing Details (indicate cue type and reason): to donn pants, edu on dressing LLE first Toilet Transfer: Minimal assistance;Rolling walker (2 wheels);Contact guard assist Toilet Transfer Details (indicate cue type and reason): stand pivot from bed to recliner                Extremity/Trunk Assessment              Vision       Perception     Praxis     Communication Communication Communication: Impaired Factors Affecting Communication: Non - English speaking, interpreter not available;Other (comment) (Son to interpret)  Cognition Arousal: Alert Behavior During Therapy: WFL for tasks assessed/performed                                 Following commands: Impaired Following commands impaired: Follows multi-step commands with increased time      Cueing   Cueing Techniques: Verbal cues  Exercises Other Exercises Other Exercises: Edu on incorporating LUE use with all safe functional tasks, provided LUE therex HEP for ROM, strengthening and FMC/GMC. Provided resistive  sponge and son verbalized understanding and off edcuation.    Shoulder Instructions       General Comments Pt/son provided LUE therex handout in spanish and reviewed all exercises with pt and son during session with pt/son verbalizing understanding.    Pertinent Vitals/ Pain       Pain Assessment Pain Assessment: No/denies pain Pain Intervention(s): Monitored during session  Home Living                                          Prior Functioning/Environment              Frequency  Min 3X/week        Progress Toward Goals  OT Goals(current goals can now be found in the care plan section)  Progress towards OT goals: Progressing toward goals  Acute Rehab OT Goals Patient Stated Goal: get better OT Goal Formulation: With patient/family Time For Goal Achievement: 02/20/24 Potential to Achieve Goals: Good  Plan      Co-evaluation                 AM-PAC OT 6 Clicks Daily Activity     Outcome Measure   Help from another person eating meals?: A Little Help from another person taking care of personal grooming?: A Little Help from another person toileting, which includes using toliet, bedpan, or urinal?: A Little Help from another person bathing (including washing, rinsing, drying)?: A Lot Help from another person to put on and taking off regular upper body clothing?: A Little Help from another person to put on and taking off regular lower body clothing?: A Lot 6 Click Score: 16    End of Session Equipment Utilized During Treatment: Rolling walker (2 wheels)  OT Visit Diagnosis: Other abnormalities of gait and mobility (R26.89);Unsteadiness on feet (R26.81);Muscle weakness (generalized) (M62.81);Hemiplegia and hemiparesis Hemiplegia - Right/Left: Left Hemiplegia - caused by: Cerebral infarction   Activity Tolerance Patient tolerated treatment well   Patient Left in chair;with call bell/phone within reach;with chair alarm set;with  family/visitor present   Nurse Communication Mobility status        Time: 8898-8874 OT Time Calculation (min): 24 min  Charges: OT General Charges $OT Visit: 1 Visit OT Treatments $Self Care/Home Management : 8-22 mins $Neuromuscular Re-education: 8-22 mins  Eulalio Reamy Chrismon, OTR/L  02/07/2024, 12:22 PM   Philomina Leon E Chrismon 02/07/2024, 12:19 PM

## 2024-02-07 NOTE — Discharge Summary (Signed)
 " Physician Discharge Summary   Patient: Derek George MRN: [id number] DOB: 20-Sep-1965  Admit date:     02/05/2024  Discharge date: 02/07/2024  Discharge Physician: Leita Blanch   PCP: [facility], [facility]   Recommendations at discharge:   follow-up with [facility] in 1 to 2 week keep log of his sugars at home discussed with PCP  Discharge Diagnoses: Principal Problem:   Left-sided weakness Active Problems:   Acute CVA (cerebrovascular accident) (HCC)   Diabetes mellitus (HCC)   Primary hypertension  Derek George is a 58 y.o. male with medical history significant of type II diabetes  comes to the emergency room accompanied by son with complaints of slurred speech and left-sided weakness upper and lower extremity.  Patient is Spanish-speaking. History is obtained with assistance of son and brother. Patient said he went to bed around 7 PM woke around 3 AM complaining of left-sided numbness and difficulty speaking. His son spoke with him on the phone and he could not understand much went to the house brought him here to the emergency room.   Acute pontine infarct  -- patient came in with left upper and lower extremity weakness which slurred speech  -- patient symptoms improving. Not ITP candidate as determined by tele neuro- -- MRI brain results reviewed -- neurology recommends start aspirin  81 mg daily and Plavix  75 mg daily---and then continue aspirin  81 mg daily after 21 days -- continue statins + zetia  -- PT OT to see patient-- recommends inpatient rehab-- since patient is uninsured family is not able to afford it. Will arrange home health with charity. Patient will go home. This was discussed with patient's son midnight   Type II diabetes, uncontrolled -- diabetes coordinator, sliding scale insulin . Resume home meds  -- A1c 8.8    Hypertension -- will allow permissive hypertension for now -- patient takes amlodipine  at home-- now resumed at discharge    obesity -- diet and exercise discussed with patient   Discussed overall plan with patient and patient's son Derek George. TOC for discharge planning.   PT OT recommends IPR. Patient currently has no payer source. If family not able to pay privately then consider home health with charity         Consultants: neurology Procedures performed: none Disposition: Home health Diet recommendation:  Discharge Diet Orders (From admission, onward)     Start     Ordered   02/07/24 0000  Diet Carb Modified        02/07/24 1051   02/07/24 0000  Diet - low sodium heart healthy        02/07/24 1051           Cardiac and Carb modified diet DISCHARGE MEDICATION: Allergies as of 02/07/2024       Reactions   Metformin Shortness Of Breath   Reports liver damage Reports liver damage Reports liver damage        Medication List     STOP taking these medications    ibuprofen  600 MG tablet Commonly known as: ADVIL        TAKE these medications    amLODipine  5 MG tablet Commonly known as: NORVASC  Take 1 tablet (5 mg total) by mouth daily.   aspirin  EC 81 MG tablet Take 1 tablet (81 mg total) by mouth daily. Swallow whole. Start taking on: February 08, 2024   clopidogrel  75 MG tablet Commonly known as: PLAVIX  Take 1 tablet (75 mg total) by mouth daily for 21 days. Start  taking on: February 08, 2024   ezetimibe  10 MG tablet Commonly known as: ZETIA  Take 1 tablet (10 mg total) by mouth daily.   glipiZIDE  10 MG tablet Commonly known as: GLUCOTROL  Take 1 tablet (10 mg total) by mouth 2 (two) times daily before a meal.   rosuvastatin  40 MG tablet Commonly known as: CRESTOR  Take 1 tablet (40 mg total) by mouth at bedtime. What changed:  medication strength how much to take   Tradjenta  5 MG Tabs tablet Generic drug: linagliptin  Take 1 tablet (5 mg total) by mouth daily.               Durable Medical Equipment  (From admission, onward)           Start      Ordered   02/07/24 0857  For home use only DME Bedside commode  Once       Question:  Patient needs a bedside commode to treat with the following condition  Answer:  Acute CVA (cerebrovascular accident) (HCC)   02/07/24 0856   02/07/24 0857  For home use only DME Walker rolling  Once       Question Answer Comment  Walker: With 5 Inch Wheels   Patient needs a walker to treat with the following condition Acute CVA (cerebrovascular accident) (HCC)      02/07/24 0856            Follow-up Information     Center, [facility]. Schedule an appointment as soon as possible for a visit in 1 week(s).   Specialty: General Practice Contact information: [address] [place] [address] [phone]                Discharge Exam: Derek George   02/06/24 0810  Weight: 85.2 kg   Left hemiparesis, speech clear respiratory clear to auscultation cardiovascular both heart sounds normal no murmur Condition at discharge: fair  The results of significant diagnostics from this hospitalization (including imaging, microbiology, ancillary and laboratory) are listed below for reference.   Imaging Studies:   Microbiology: Results for orders placed or performed during the hospital encounter of 11/07/18  SARS Coronavirus 2 [facility] order, Performed in [facility] hospital lab) Nasopharyngeal Nasopharyngeal Swab     Status: None   Collection Time: 11/07/18  1:14 AM   Specimen: Nasopharyngeal Swab  Result Value Ref Range Status   SARS Coronavirus 2 NEGATIVE NEGATIVE Final    Comment: (NOTE) If result is NEGATIVE SARS-CoV-2 target nucleic acids are NOT DETECTED. The SARS-CoV-2 RNA is generally detectable in upper and lower  respiratory specimens during the acute phase of infection. The lowest  concentration of SARS-CoV-2 viral copies this assay can detect is 250  copies / mL. A negative result does not preclude SARS-CoV-2 infection  and should not be used as the sole  basis for treatment or other  patient management decisions.  A negative result may occur with  improper specimen collection / handling, submission of specimen other  than nasopharyngeal swab, presence of viral mutation(s) within the  areas targeted by this assay, and inadequate number of viral copies  (<250 copies / mL). A negative result must be combined with clinical  observations, patient history, and epidemiological information. If result is POSITIVE SARS-CoV-2 target nucleic acids are DETECTED. The SARS-CoV-2 RNA is generally detectable in upper and lower  respiratory specimens dur ing the acute phase of infection.  Positive  results are indicative of active infection with SARS-CoV-2.  Clinical  correlation with patient history and other diagnostic information is  necessary to determine patient infection status.  Positive results do  not rule out bacterial infection or co-infection with other viruses. If result is PRESUMPTIVE POSTIVE SARS-CoV-2 nucleic acids MAY BE PRESENT.   A presumptive positive result was obtained on the submitted specimen  and confirmed on repeat testing.  While 2019 novel coronavirus  (SARS-CoV-2) nucleic acids may be present in the submitted sample  additional confirmatory testing may be necessary for epidemiological  and / or clinical management purposes  to differentiate between  SARS-CoV-2 and other Sarbecovirus currently known to infect humans.  If clinically indicated additional testing with an alternate test  methodology [phone]) is advised. The SARS-CoV-2 RNA is generally  detectable in upper and lower respiratory sp ecimens during the acute  phase of infection. The expected result is Negative. Fact Sheet for Patients:  boilerbrush.com.cy Fact Sheet for Healthcare Providers: https://pope.com/ This test is not yet approved or cleared by the [place]  FDA and has been authorized for detection and/or  diagnosis of SARS-CoV-2 by FDA under an Emergency Use Authorization (EUA).  This EUA will remain in effect (meaning this test can be used) for the duration of the COVID-19 declaration under Section 564(b)(1) of the Act, 21 U.S.C. section 360bbb-3(b)(1), unless the authorization is terminated or revoked sooner. Performed at [facility], [address] [place], [place] [address]   Culture, blood (routine x 2)     Status: None   Collection Time: 11/08/18  2:33 PM   Specimen: BLOOD  Result Value Ref Range Status   Specimen Description BLOOD RIGHT ANTECUBITAL  Final   Special Requests   Final    Blood Culture results may not be optimal due to an inadequate volume of blood received in culture bottles   Culture   Final    NO GROWTH 5 DAYS Performed at [facility], [address], [place], [place] [address]    Report Status 11/13/2018 FINAL  Final  Culture, blood (routine x 2)     Status: None   Collection Time: 11/08/18  3:39 PM   Specimen: BLOOD  Result Value Ref Range Status   Specimen Description BLOOD BLOOD RIGHT FOREARM  Final   Special Requests Blood Culture adequate volume  Final   Culture   Final    NO GROWTH 5 DAYS Performed at [facility], [address] [place], [place] [address]    Report Status 11/13/2018 FINAL  Final    Labs: CBC: Recent Labs  Lab 02/05/24 0621 02/05/24 1128  WBC 8.5 10.5  NEUTROABS 6.6  --   HGB 14.2 14.8  HCT 41.5 42.6  MCV 89.1 87.3  PLT 248 251   Basic Metabolic Panel: Recent Labs  Lab 02/05/24 0621 02/05/24 1128  NA 135  --   K 4.3  --   CL 100  --   CO2 26  --   GLUCOSE 339*  --   BUN 18  --   CREATININE 1.01 0.90  CALCIUM  9.1  --    Liver Function Tests: Recent Labs  Lab 02/05/24 0621  AST 25  ALT 25  ALKPHOS 100  BILITOT 0.2  PROT 7.1  ALBUMIN 4.0   CBG: Recent Labs  Lab 02/06/24 0840 02/06/24 1149 02/06/24 1621 02/06/24 2056 02/07/24 0724  GLUCAP 171* 201* 166* 100* 129*     Discharge time spent: greater than 30 minutes.  Signed: Leita Blanch, MD [facility] 02/07/2024 "

## 2024-02-07 NOTE — Progress Notes (Signed)
 Subjective: Patient ambulating with walker.  Facial droop improved.  Continued left sided weakness  Objective: Current vital signs: BP (!) 158/82 (BP Location: Right Arm)   Pulse 69   Temp 98.2 F (36.8 C) (Oral)   Resp 19   Ht 5' 5 (1.651 m)   Wt 85.2 kg   SpO2 95%   BMI 31.25 kg/m  Vital signs in last 24 hours: Temp:  [97.6 F (36.4 C)-98.4 F (36.9 C)] 98.2 F (36.8 C) (12/24 0723) Pulse Rate:  [67-83] 69 (12/24 0723) Resp:  [16-19] 19 (12/24 0723) BP: (113-178)/(47-83) 158/82 (12/24 0723) SpO2:  [95 %-100 %] 95 % (12/24 0723)  Intake/Output from previous day: No intake/output data recorded. Intake/Output this shift: No intake/output data recorded. Nutritional status:  Diet Order             Diet heart healthy/carb modified Room service appropriate? Yes; Fluid consistency: Thin  Diet effective now                   Neurologic Exam: Mental Status: Alert, oriented, thought content appropriate.  Speech fluent without evidence of aphasia.  Able to follow 3 step commands without difficulty.  Dysarthric Cranial Nerves: II: Visual fields grossly normal III,IV, VI: ptosis not present, extra-ocular motions intact bilaterally V,VII:mild left facial asymmetry, facial light touch sensation normal bilaterally VIII: hearing normal bilaterally XI: bilateral shoulder shrug XII: midline tongue extension Motor: Right :  Upper extremity   5/5                                      Left:     Upper extremity   4/5 with pronator drift             Lower extremity   5/5                                                  Lower extremity   4+/5 Tone and bulk:normal tone throughout; no atrophy noted Sensory: Pinprick and light touch intact throughout, bilaterally Deep Tendon Reflexes: 2+ and symmetric throughout Plantars: Right: upgoing                         Left: upgoing Cerebellar: normal finger-to-nose and normal heel-to-shin testing bilaterally within limits of weakness  Lab  Results: Basic Metabolic Panel: Recent Labs  Lab 02/05/24 0621 02/05/24 1128  NA 135  --   K 4.3  --   CL 100  --   CO2 26  --   GLUCOSE 339*  --   BUN 18  --   CREATININE 1.01 0.90  CALCIUM  9.1  --     Liver Function Tests: Recent Labs  Lab 02/05/24 0621  AST 25  ALT 25  ALKPHOS 100  BILITOT 0.2  PROT 7.1  ALBUMIN 4.0   No results for input(s): LIPASE, AMYLASE in the last 168 hours. No results for input(s): AMMONIA in the last 168 hours.  CBC: Recent Labs  Lab 02/05/24 0621 02/05/24 1128  WBC 8.5 10.5  NEUTROABS 6.6  --   HGB 14.2 14.8  HCT 41.5 42.6  MCV 89.1 87.3  PLT 248 251    Cardiac Enzymes: No results for input(s): CKTOTAL, CKMB, CKMBINDEX, TROPONINI  in the last 168 hours.  Lipid Panel: Recent Labs  Lab 02/06/24 0157  CHOL 190  TRIG 173*  HDL 31*  CHOLHDL 6.1  VLDL 35  LDLCALC 875*    CBG: Recent Labs  Lab 02/06/24 0840 02/06/24 1149 02/06/24 1621 02/06/24 2056 02/07/24 0724  GLUCAP 171* 201* 166* 100* 129*    Microbiology: Results for orders placed or performed during the hospital encounter of 11/07/18  SARS Coronavirus 2 [facility] order, Performed in [facility] hospital lab) Nasopharyngeal Nasopharyngeal Swab     Status: None   Collection Time: 11/07/18  1:14 AM   Specimen: Nasopharyngeal Swab  Result Value Ref Range Status   SARS Coronavirus 2 NEGATIVE NEGATIVE Final    Comment: (NOTE) If result is NEGATIVE SARS-CoV-2 target nucleic acids are NOT DETECTED. The SARS-CoV-2 RNA is generally detectable in upper and lower  respiratory specimens during the acute phase of infection. The lowest  concentration of SARS-CoV-2 viral copies this assay can detect is 250  copies / mL. A negative result does not preclude SARS-CoV-2 infection  and should not be used as the sole basis for treatment or other  patient management decisions.  A negative result may occur with  improper specimen collection / handling, submission  of specimen other  than nasopharyngeal swab, presence of viral mutation(s) within the  areas targeted by this assay, and inadequate number of viral copies  (<250 copies / mL). A negative result must be combined with clinical  observations, patient history, and epidemiological information. If result is POSITIVE SARS-CoV-2 target nucleic acids are DETECTED. The SARS-CoV-2 RNA is generally detectable in upper and lower  respiratory specimens dur ing the acute phase of infection.  Positive  results are indicative of active infection with SARS-CoV-2.  Clinical  correlation with patient history and other diagnostic information is  necessary to determine patient infection status.  Positive results do  not rule out bacterial infection or co-infection with other viruses. If result is PRESUMPTIVE POSTIVE SARS-CoV-2 nucleic acids MAY BE PRESENT.   A presumptive positive result was obtained on the submitted specimen  and confirmed on repeat testing.  While 2019 novel coronavirus  (SARS-CoV-2) nucleic acids may be present in the submitted sample  additional confirmatory testing may be necessary for epidemiological  and / or clinical management purposes  to differentiate between  SARS-CoV-2 and other Sarbecovirus currently known to infect humans.  If clinically indicated additional testing with an alternate test  methodology [id number]) is advised. The SARS-CoV-2 RNA is generally  detectable in upper and lower respiratory sp ecimens during the acute  phase of infection. The expected result is Negative. Fact Sheet for Patients:  boilerbrush.com.cy Fact Sheet for Healthcare Providers: https://pope.com/ This test is not yet approved or cleared by the [place]  FDA and has been authorized for detection and/or diagnosis of SARS-CoV-2 by FDA under an Emergency Use Authorization (EUA).  This EUA will remain in effect (meaning this test can be used) for the  duration of the COVID-19 declaration under Section 564(b)(1) of the Act, 21 U.S.C. section 360bbb-3(b)(1), unless the authorization is terminated or revoked sooner. Performed at [facility], [address] [place], [place] [address]   Culture, blood (routine x 2)     Status: None   Collection Time: 11/08/18  2:33 PM   Specimen: BLOOD  Result Value Ref Range Status   Specimen Description BLOOD RIGHT ANTECUBITAL  Final   Special Requests   Final    Blood Culture results may not  be optimal due to an inadequate volume of blood received in culture bottles   Culture   Final    NO GROWTH 5 DAYS Performed at [facility], [address] [place], [place] [address]    Report Status 11/13/2018 FINAL  Final  Culture, blood (routine x 2)     Status: None   Collection Time: 11/08/18  3:39 PM   Specimen: BLOOD  Result Value Ref Range Status   Specimen Description BLOOD BLOOD RIGHT FOREARM  Final   Special Requests Blood Culture adequate volume  Final   Culture   Final    NO GROWTH 5 DAYS Performed at [facility], [address] [place], [place] [address]    Report Status 11/13/2018 FINAL  Final    Coagulation Studies: Recent Labs    02/05/24 0621  LABPROT 12.7  INR 0.9    Imaging: ECHOCARDIOGRAM COMPLETE Result Date: 02/06/2024    ECHOCARDIOGRAM REPORT   Patient Name:   [facility] Date of Exam: 02/06/2024 Medical Rec #:  [id number]        Height:       65.0 in Accession #:    [id number]       Weight:       185.4 lb Date of Birth:  May 20, 1965         BSA:          1.915 m Patient Age:    58 years         BP:           121/73 mmHg Patient Gender: M                HR:           64 bpm. Exam Location:  [facility] Procedure: 2D Echo, Cardiac Doppler, Color Doppler, 3D Echo, Strain Analysis and            Saline Contrast Bubble Study (Both Spectral and Color Flow Doppler            were utilized during procedure). Indications:     Stroke I63.9  History:          Patient has no prior history of Echocardiogram examinations.                  Risk Factors:Diabetes.  Sonographer:     Christopher Furnace Referring Phys:  [id number] SONA PATEL Diagnosing Phys: Deatrice Cage MD  Sonographer Comments: Global longitudinal strain was attempted. IMPRESSIONS  1. Left ventricular ejection fraction, by estimation, is 55 to 60%. The left ventricle has normal function. The left ventricle has no regional wall motion abnormalities. There is mild left ventricular hypertrophy. Left ventricular diastolic parameters were normal. The average left ventricular global longitudinal strain is -16.0 %. The global longitudinal strain is normal.  2. Right ventricular systolic function is normal. The right ventricular size is normal. Tricuspid regurgitation signal is inadequate for assessing PA pressure.  3. The mitral valve is normal in structure. No evidence of mitral valve regurgitation. No evidence of mitral stenosis.  4. The aortic valve has an indeterminant number of cusps. Aortic valve regurgitation is not visualized. Aortic valve sclerosis/calcification is present, without any evidence of aortic stenosis.  5. Agitated saline contrast bubble study was negative, with no evidence of any interatrial shunt. FINDINGS  Left Ventricle: Left ventricular ejection fraction, by estimation, is 55 to 60%. The left ventricle has normal function. The left ventricle has no regional wall motion abnormalities. The average left  ventricular global longitudinal strain is -16.0 %. Strain was performed and the global longitudinal strain is normal. 3D ejection fraction reviewed and evaluated as part of the interpretation. Alternate measurement of EF is felt to be most reflective of LV function. The left ventricular internal cavity size was normal in size. There is mild left ventricular hypertrophy. Left ventricular diastolic parameters were normal. Right Ventricle: The right ventricular size is normal. No increase in right ventricular  wall thickness. Right ventricular systolic function is normal. Tricuspid regurgitation signal is inadequate for assessing PA pressure. Left Atrium: Left atrial size was normal in size. Right Atrium: Right atrial size was normal in size. Pericardium: There is no evidence of pericardial effusion. Mitral Valve: The mitral valve is normal in structure. No evidence of mitral valve regurgitation. No evidence of mitral valve stenosis. MV peak gradient, 4.2 mmHg. The mean mitral valve gradient is 2.0 mmHg. Tricuspid Valve: The tricuspid valve is normal in structure. Tricuspid valve regurgitation is not demonstrated. No evidence of tricuspid stenosis. Aortic Valve: The aortic valve has an indeterminant number of cusps. Aortic valve regurgitation is not visualized. Aortic valve sclerosis/calcification is present, without any evidence of aortic stenosis. Aortic valve mean gradient measures 2.0 mmHg. Aortic valve peak gradient measures 3.0 mmHg. Aortic valve area, by VTI measures 2.96 cm. Pulmonic Valve: The pulmonic valve was normal in structure. Pulmonic valve regurgitation is not visualized. No evidence of pulmonic stenosis. Aorta: The aortic root is normal in size and structure. Venous: The inferior vena cava was not well visualized. IAS/Shunts: No atrial level shunt detected by color flow Doppler. Agitated saline contrast was given intravenously to evaluate for intracardiac shunting. Agitated saline contrast bubble study was negative, with no evidence of any interatrial shunt.  LEFT VENTRICLE PLAX 2D LVIDd:         4.30 cm   Diastology LVIDs:         3.00 cm   LV e' medial:    5.87 cm/s LV PW:         1.20 cm   LV E/e' medial:  17.0 LV IVS:        1.10 cm   LV e' lateral:   6.20 cm/s LVOT diam:     2.00 cm   LV E/e' lateral: 16.1 LV SV:         57 LV SV Index:   30        2D Longitudinal Strain LVOT Area:     3.14 cm  2D Strain GLS Avg:     -16.0 % LV IVRT:       119 msec  RIGHT VENTRICLE RV Basal diam:  2.90 cm     PULMONARY VEINS RV Mid diam:    3.00 cm    Diastolic Velocity: 35.40 cm/s RV S prime:     9.36 cm/s  S/D Velocity:       1.70 TAPSE (M-mode): 2.2 cm     Systolic Velocity:  58.70 cm/s LEFT ATRIUM             Index        RIGHT ATRIUM          Index LA diam:        2.20 cm 1.15 cm/m   RA Area:     6.35 cm LA Vol (A2C):   32.3 ml 16.86 ml/m  RA Volume:   8.52 ml  4.45 ml/m LA Vol (A4C):   23.7 ml 12.37 ml/m LA Biplane Vol: 29.0 ml 15.14  ml/m  AORTIC VALVE AV Area (Vmax):    2.89 cm AV Area (Vmean):   2.80 cm AV Area (VTI):     2.96 cm AV Vmax:           86.60 cm/s AV Vmean:          59.400 cm/s AV VTI:            0.193 m AV Peak Grad:      3.0 mmHg AV Mean Grad:      2.0 mmHg LVOT Vmax:         79.70 cm/s LVOT Vmean:        52.900 cm/s LVOT VTI:          0.182 m LVOT/AV VTI ratio: 0.94  AORTA Ao Root diam: 3.20 cm MITRAL VALVE               TRICUSPID VALVE MV Area (PHT): 3.93 cm    TR Peak grad:   7.1 mmHg MV Area VTI:   1.95 cm    TR Vmax:        133.00 cm/s MV Peak grad:  4.2 mmHg MV Mean grad:  2.0 mmHg    SHUNTS MV Vmax:       1.03 m/s    Systemic VTI:  0.18 m MV Vmean:      67.1 cm/s   Systemic Diam: 2.00 cm MV Decel Time: 193 msec MV E velocity: 99.60 cm/s MV A velocity: 79.70 cm/s MV E/A ratio:  1.25 Deatrice Cage MD Electronically signed by Deatrice Cage MD Signature Date/Time: 02/06/2024/11:08:38 AM    Final    CT ANGIO HEAD NECK W WO CM (CODE STROKE) Result Date: 02/05/2024 EXAM: CTA Head and Neck with Intravenous Contrast. CT Head without Contrast. CLINICAL HISTORY: Stroke alert, rule out LVO. TECHNIQUE: Axial CTA images of the head and neck performed with intravenous contrast. MIP reconstructed images were created and reviewed. Axial computed tomography images of the head/brain performed without intravenous contrast. Note: Per PQRS, the description of internal carotid artery percent stenosis, including 0 percent or normal exam, is based on [employer] Symptomatic Carotid Endarterectomy  Trial (NASCET) criteria. Dose reduction technique was used including one or more of the following: automated exposure control, adjustment of mA and kV according to patient size, and/or iterative reconstruction. CONTRAST: With; COMPARISON: None provided. FINDINGS: CT HEAD: BRAIN: No acute intraparenchymal hemorrhage. No mass lesion. No CT evidence for acute territorial infarct. No midline shift or extra-axial collection. VENTRICLES: No hydrocephalus. ORBITS: The orbits are unremarkable. SINUSES AND MASTOIDS: Moderate right maxillary sinus mucosal thickening. CTA NECK: COMMON CAROTID ARTERIES: No significant stenosis. No dissection or occlusion. INTERNAL CAROTID ARTERIES: No stenosis by NASCET criteria. No dissection or occlusion. VERTEBRAL ARTERIES: Severe right vertebral origin stenosis. No dissection or occlusion. CTA HEAD: ANTERIOR CEREBRAL ARTERIES: Severe bilateral A3 ACA stenosis. No occlusion. No aneurysm. MIDDLE CEREBRAL ARTERIES: Moderate distal left M1 MCA stenosis. No occlusion. No aneurysm. POSTERIOR CEREBRAL ARTERIES: Severe right P1 and left P2 PCA stenosis. No occlusion. No aneurysm. BASILAR ARTERY: No significant stenosis. No occlusion. No aneurysm. OTHER: SOFT TISSUES: No acute finding. No masses or lymphadenopathy. BONES: No acute osseous abnormality. IMPRESSION: 1. No evidence of acute intracranial abnormality. ASPECTS 10. 2. No large vessel occlusion. 3. 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 4. Severe bilateral A3 ACA stenosis. 5. Severe right vertebral origin stenosis. 6. Moderate distal left M1 MCA stenosis. Electronically signed by: Gilmore Molt 02/05/2024 09:29 PM EST RP Workstation: [id number]  MR BRAIN WO CONTRAST Result Date: 02/05/2024 EXAM: MRI BRAIN WITHOUT CONTRAST 02/05/2024 12:36:07 PM TECHNIQUE: Multiplanar multisequence MRI of the head/brain was performed without the administration of intravenous contrast. COMPARISON: CT Head 02/05/2024. CLINICAL HISTORY: Stroke, follow  up. Change in speech and left sided numbness. FINDINGS: BRAIN AND VENTRICLES: Patchy foci of restricted diffusion involving the bilateral anterior pons with associated T2/FLAIR hyperintensity. Mild to moderate subcortical and periventricular T2 and FLAIR signal hyperintensity likely reflecting sequelae of chronic microvascular ischemia. 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 A couple small foci of susceptibility within the left parietal/ occipital lobe which may relate to the sequelae of prior micro hemorrhage. No mass. No midline shift. No hydrocephalus. The sella is unremarkable. Normal flow voids. ORBITS: Bilateral lens replacement. SINUSES AND MASTOIDS: Polypoid mucosal thickening right maxillary sinus. Trace left mastoid effusion. BONES AND SOFT TISSUES: Normal marrow signal. No acute soft tissue abnormality. IMPRESSION: 1. Patchy foci of restricted diffusion involving the anterior pons consistent with acute infarction. 2. No acute intracranial hemorrhage. Electronically signed by: Prentice Spade MD 02/05/2024 03:39 PM EST RP Workstation: [id number]    Medications: I have reviewed the patient's current medications. Scheduled:  amLODipine   5 mg Oral Daily   aspirin  EC  81 mg Oral Daily   clopidogrel   75 mg Oral Daily   enoxaparin  (LOVENOX ) injection  40 mg Subcutaneous Q24H   ezetimibe   10 mg Oral Daily   glipiZIDE   10 mg Oral BID AC   insulin  aspart  0-5 Units Subcutaneous QHS   insulin  aspart  0-9 Units Subcutaneous TID WC   linagliptin   5 mg Oral Daily   rosuvastatin   40 mg Oral QHS   sodium chloride  flush  3 mL Intravenous Once    Assessment/Plan: 59 y.o. male with a history of DM who presented with complaints of left sided weakness and dysarthria.  Some worsening since yesterday's evaluation.  CT/CTA of the head and neck performed in follow up and shows severe right P1, left P2, bilateral A3 and right vertebral origin stenosis.   Moderate distal left M1 stenosis noted as well.  Echocardiogram reveals EF of 55-60% with no evidence of PFO noted.  A1c 8.8, LDL 124.   Patient stable.    Recommendations: 1. Blood sugar management.  Goal A1c<7.0. 2. To continue therapy at home 3. Prophylactic therapy-Dual antiplatelet therapy with ASA 81mg  and Plavix  75mg  for three weeks with change to ASA 81mg  alone as monotherapy after that time. Continued Crestor  at current dose 4. No further neurologic intervention is recommended at this time.  If further questions arise, please call or page at that time.  Thank you for allowing neurology to participate in the care of this patient.  May follow up with neurology as an outpatient  Sonny Hock, MD Neurology  02/07/2024  9:19 AM   LOS: 1 day

## 2024-02-07 NOTE — TOC Transition Note (Signed)
 Transition of Care [facility]) - Discharge Note   Patient Details  Name: Derek George MRN: [id number] Date of Birth: 1965-03-16  [facility]) CM/SW Contact:  Daved JONETTA Hamilton, RN Phone Number: 02/07/2024, 11:17 AM   Clinical Narrative:     Patient will DC to: Home with [facility] Anticipated DC date: 02/07/2024 Family notified: Patient notified son Transport by: Son Jenna Chard  Per MD patient ready for DC to [facility] with [facility] RN, patient, patient's family, and agency notified of DC. HH order sent to agency in HUB.   TOC signing off.   Final next level of care: Home w [facility] Health Services ([facility]) Barriers to Discharge: Barriers Resolved   Patient Goals and CMS Choice            Discharge Placement                  Name of family member notified: Patient notified son Patient and family notified of of transfer: 02/07/24  Discharge Plan and Services Additional resources added to the After Visit Summary for                  DME Arranged: Walker rolling, Bedside commode DME Agency: [employer] Date DME Agency Contacted: 02/07/24 Time DME Agency Contacted: [phone] Representative spoke with at DME Agency: [place] HH Arranged: PT, OT HH Agency: [facility] Date [facility] Agency Contacted: 02/07/24 Time HH Agency Contacted: [phone] Representative spoke with at [facility] Agency: [place]  Social Drivers of Health (SDOH) Interventions SDOH Screenings   Food Insecurity: Patient Declined (02/05/2024)  Housing: Patient Declined (02/05/2024)  Transportation Needs: Patient Declined (02/05/2024)  Utilities: Patient Declined (02/05/2024)  Social Connections: Unknown (02/05/2024)  Tobacco Use: Low Risk (02/06/2024)     Readmission Risk Interventions     No data to display

## 2024-02-07 NOTE — TOC CM/SW Note (Cosign Needed)
 Patient is not able to walk the distance required to go the bathroom, or he/she is unable to safely negotiate stairs required to access the bathroom.  A 3in1 BSC will alleviate this problem

## 2024-02-07 NOTE — Progress Notes (Signed)
 Speech Language Pathology Treatment: Cognitive-Linguistic  Patient Details Name: Derek George MRN: [id number] DOB: 1965-04-04 Today's Date: 02/07/2024 Time: 9064-9051 SLP Time Calculation (min) (ACUTE ONLY): 13 min  Assessment / Plan / Recommendation Clinical Impression  Pt seen for dysarthria tx. Pt received in semi-Fowler's position. Pt alert, pleasant, and cooperative. Flat affect continues. Videointerpretation utilized. Marked improvement in speech intelligibility this date, 100% to unfamiliar listener. Mild and occasional articulatory imprecision noted. Reviewed compensations for improved speech intelligibility and general communication strategies. Handout provided in Spanish for review. No further ST needs at this time.    HPI HPI: Pt is a 58 y/o M admitted on 02/05/24 after presenting with c/o L sided numbness & changes in speech. MRI shows Patchy foci of restricted diffusion involving the anterior pons consistent  with acute infarction. PMH: DM2         Recommendations  No f/u ST                       (defer to OT/PT) Dysarthria and anarthria (R47.1)          Delon Bangs, M.S., CCC-SLP Speech-Language Pathologist [facility] [phone] (ASCOM)  [name]  02/07/2024, 10:01 AM

## 2024-03-05 ENCOUNTER — Other Ambulatory Visit: Payer: Self-pay

## 2024-03-14 ENCOUNTER — Other Ambulatory Visit: Payer: Self-pay

## 2024-03-14 ENCOUNTER — Emergency Department: Payer: Self-pay

## 2024-03-14 ENCOUNTER — Emergency Department
Admission: EM | Admit: 2024-03-14 | Discharge: 2024-03-14 | Disposition: A | Discharge status: Home / Self Care | Payer: Self-pay

## 2024-03-14 DIAGNOSIS — R109 Unspecified abdominal pain: Secondary | ICD-10-CM

## 2024-03-14 DIAGNOSIS — R1032 Left lower quadrant pain: Secondary | ICD-10-CM | POA: Insufficient documentation

## 2024-03-14 DIAGNOSIS — I159 Secondary hypertension, unspecified: Secondary | ICD-10-CM | POA: Insufficient documentation

## 2024-03-14 DIAGNOSIS — I10 Essential (primary) hypertension: Secondary | ICD-10-CM | POA: Insufficient documentation

## 2024-03-14 DIAGNOSIS — R202 Paresthesia of skin: Secondary | ICD-10-CM | POA: Insufficient documentation

## 2024-03-14 DIAGNOSIS — R200 Anesthesia of skin: Secondary | ICD-10-CM

## 2024-03-14 DIAGNOSIS — D72829 Elevated white blood cell count, unspecified: Secondary | ICD-10-CM | POA: Insufficient documentation

## 2024-03-14 DIAGNOSIS — Z7982 Long term (current) use of aspirin: Secondary | ICD-10-CM | POA: Insufficient documentation

## 2024-03-14 DIAGNOSIS — R1012 Left upper quadrant pain: Secondary | ICD-10-CM | POA: Insufficient documentation

## 2024-03-14 DIAGNOSIS — E1165 Type 2 diabetes mellitus with hyperglycemia: Secondary | ICD-10-CM | POA: Insufficient documentation

## 2024-03-14 DIAGNOSIS — Z7984 Long term (current) use of oral hypoglycemic drugs: Secondary | ICD-10-CM | POA: Insufficient documentation

## 2024-03-14 DIAGNOSIS — Z79899 Other long term (current) drug therapy: Secondary | ICD-10-CM | POA: Insufficient documentation

## 2024-03-14 LAB — CBG MONITORING, ED: Glucose-Capillary: 202 mg/dL — ABNORMAL HIGH (ref 70–99)

## 2024-03-14 LAB — URINALYSIS, ROUTINE W REFLEX MICROSCOPIC
Bacteria, UA: NONE SEEN
Bilirubin Urine: NEGATIVE
Glucose, UA: 150 mg/dL — AB
Ketones, ur: NEGATIVE mg/dL
Leukocytes,Ua: NEGATIVE
Nitrite: NEGATIVE
Protein, ur: 100 mg/dL — AB
Specific Gravity, Urine: 1.005 (ref 1.005–1.030)
Squamous Epithelial / HPF: 0 /HPF (ref 0–5)
pH: 6 (ref 5.0–8.0)

## 2024-03-14 LAB — COMPREHENSIVE METABOLIC PANEL WITH GFR
ALT: 34 U/L (ref 0–44)
AST: 32 U/L (ref 15–41)
Albumin: 4.4 g/dL (ref 3.5–5.0)
Alkaline Phosphatase: 99 U/L (ref 38–126)
Anion gap: 11 (ref 5–15)
BUN: 12 mg/dL (ref 6–20)
CO2: 24 mmol/L (ref 22–32)
Calcium: 9.5 mg/dL (ref 8.9–10.3)
Chloride: 103 mmol/L (ref 98–111)
Creatinine, Ser: 0.95 mg/dL (ref 0.61–1.24)
GFR, Estimated: >60 mL/min
Glucose, Bld: 207 mg/dL — ABNORMAL HIGH (ref 70–99)
Potassium: 4.3 mmol/L (ref 3.5–5.1)
Sodium: 139 mmol/L (ref 135–145)
Total Bilirubin: 0.3 mg/dL (ref 0.0–1.2)
Total Protein: 7.3 g/dL (ref 6.5–8.1)

## 2024-03-14 LAB — DIFFERENTIAL
Abs Immature Granulocytes: 0.04 10*3/uL (ref 0.00–0.07)
Basophils Absolute: 0.1 10*3/uL (ref 0.0–0.1)
Basophils Relative: 1 %
Eosinophils Absolute: 0.1 10*3/uL (ref 0.0–0.5)
Eosinophils Relative: 1 %
Immature Granulocytes: 0 %
Lymphocytes Relative: 14 %
Lymphs Abs: 1.5 10*3/uL (ref 0.7–4.0)
Monocytes Absolute: 0.8 10*3/uL (ref 0.1–1.0)
Monocytes Relative: 7 %
Neutro Abs: 8.4 10*3/uL — ABNORMAL HIGH (ref 1.7–7.7)
Neutrophils Relative %: 77 %

## 2024-03-14 LAB — PROTIME-INR
INR: 1 (ref 0.8–1.2)
Prothrombin Time: 13.3 s (ref 11.4–15.2)

## 2024-03-14 LAB — ETHANOL: Alcohol, Ethyl (B): <15 mg/dL

## 2024-03-14 LAB — CBC
HCT: 43.8 % (ref 39.0–52.0)
Hemoglobin: 14.7 g/dL (ref 13.0–17.0)
MCH: 29.8 pg (ref 26.0–34.0)
MCHC: 33.6 g/dL (ref 30.0–36.0)
MCV: 88.7 fL (ref 80.0–100.0)
Platelets: 235 10*3/uL (ref 150–400)
RBC: 4.94 MIL/uL (ref 4.22–5.81)
RDW: 12.6 % (ref 11.5–15.5)
WBC: 10.9 10*3/uL — ABNORMAL HIGH (ref 4.0–10.5)
nRBC: 0 % (ref 0.0–0.2)

## 2024-03-14 LAB — APTT: aPTT: 29 s (ref 24–36)

## 2024-03-14 MED ORDER — FAMOTIDINE IN NACL 20-0.9 MG/50ML-% IV SOLN
20.0000 mg | Freq: Once | INTRAVENOUS | Status: AC
  Administered 2024-03-14: 20 mg via INTRAVENOUS
  Filled 2024-03-14: qty 50

## 2024-03-14 MED ORDER — PANTOPRAZOLE SODIUM 20 MG PO TBEC: 20.0000 mg | DELAYED_RELEASE_TABLET | Freq: Every day | ORAL | 0 refills | Status: AC

## 2024-03-14 MED ORDER — SODIUM CHLORIDE 0.9% FLUSH
3.0000 mL | Freq: Once | INTRAVENOUS | Status: AC
  Administered 2024-03-14: 3 mL via INTRAVENOUS

## 2024-03-14 MED ORDER — IOHEXOL 300 MG/ML  SOLN
100.0000 mL | Freq: Once | INTRAMUSCULAR | Status: AC | PRN
  Administered 2024-03-14: 100 mL via INTRAVENOUS

## 2024-03-14 MED ORDER — SUCRALFATE 1 G PO TABS
1.0000 g | ORAL_TABLET | Freq: Once | ORAL | Status: AC
  Administered 2024-03-14: 1 g via ORAL
  Filled 2024-03-14: qty 1

## 2024-03-14 NOTE — Progress Notes (Signed)
 CODE STROKE- PHARMACY COMMUNICATION   Time CODE STROKE called/page received:[id number]  Time response to CODE STROKE was made (in person or via phone): immediately  Time Stroke Kit retrieved from Pyxis (only if needed):not needed  Name of Provider/Nurse contacted:Dr Germaine  Past Medical History:  Diagnosis Date   Diabetes mellitus without complication (HCC)    Prior to Admission medications  Medication Sig Start Date End Date Taking? Authorizing Provider  amLODipine  (NORVASC ) 5 MG tablet Take 1 tablet (5 mg total) by mouth daily. 02/07/24   Tobie Calix, MD  aspirin  EC 81 MG tablet Take 1 tablet (81 mg total) by mouth daily. Swallow whole. 02/08/24   Patel, Sona, MD  ezetimibe  (ZETIA ) 10 MG tablet Take 1 tablet (10 mg total) by mouth daily. 02/07/24   Patel, Sona, MD  glipiZIDE  (GLUCOTROL ) 10 MG tablet Take 1 tablet (10 mg total) by mouth 2 (two) times daily before a meal. 02/07/24   Tobie Calix, MD  rosuvastatin  (CRESTOR ) 40 MG tablet Take 1 tablet (40 mg total) by mouth at bedtime. 02/07/24   Tobie Calix, MD  TRADJENTA  5 MG TABS tablet Take 1 tablet (5 mg total) by mouth daily. 02/07/24   Patel, Sona, MD    Derek George, PharmD Pharmacy Resident  03/14/2024 2:31 PM

## 2024-03-14 NOTE — Consult Note (Signed)
 Reason for Consult:Left sided numbness Requesting Physician: Nicholaus  CC: SOB  I have been asked by Dr. Nicholaus to see this patient in consultation for SOB.  HPI: Derek George is an 59 y.o. male with a history of DM who went to the clinic today because he was having SOB at home that he relates to cold air coming out of his vents.  BP was elevated and was sent to the ED.  Patient also reports that for the past couple of weeks he has had tingling in the left upper and lower extremities.  It has been episodic and episodes last about 30 minutes before resolving spontaneously.  Currently without tingling but has had an episode today.    LKW: 03/14/2024 @ 1230 TNK candidate: No, resolution of symptoms Thrombectomy candidate: No, resolution of symptoms mRankin: 0  Neurologist arrival: 1429  Past Medical History:  Diagnosis Date   Diabetes mellitus without complication (HCC)     Past Surgical History:  Procedure Laterality Date   APPENDECTOMY     CHOLECYSTECTOMY N/A 11/07/2018   Procedure: LAPAROSCOPIC CHOLECYSTECTOMY WITH INTRAOPERATIVE CHOLANGIOGRAM;  Surgeon: Jordis Laneta FALCON, MD;  Location: [facility] ORS;  Service: General;  Laterality: N/A;   ERCP N/A 11/09/2018   Procedure: ENDOSCOPIC RETROGRADE CHOLANGIOPANCREATOGRAPHY (ERCP);  Surgeon: Jinny Carmine, MD;  Location: [facility];  Service: Endoscopy;  Laterality: N/A;    History reviewed. No pertinent family history.  Social History:  reports that he has never smoked. He has never used smokeless tobacco. He reports that he does not drink alcohol and does not use drugs.  Allergies[1]  Medications:  Prior to Admission medications  Medication Sig Start Date End Date Taking? Authorizing Provider  amLODipine  (NORVASC ) 5 MG tablet Take 1 tablet (5 mg total) by mouth daily. 02/07/24   Tobie Calix, MD  aspirin  EC 81 MG tablet Take 1 tablet (81 mg total) by mouth daily. Swallow whole. 02/08/24   Patel, Sona, MD  ezetimibe  (ZETIA ) 10 MG tablet  Take 1 tablet (10 mg total) by mouth daily. 02/07/24   Patel, Sona, MD  glipiZIDE  (GLUCOTROL ) 10 MG tablet Take 1 tablet (10 mg total) by mouth 2 (two) times daily before a meal. 02/07/24   Tobie Calix, MD  rosuvastatin  (CRESTOR ) 40 MG tablet Take 1 tablet (40 mg total) by mouth at bedtime. 02/07/24   Patel, Sona, MD  TRADJENTA  5 MG TABS tablet Take 1 tablet (5 mg total) by mouth daily. 02/07/24   Tobie Calix, MD     ROS: History obtained from the patient  General ROS: negative for - chills, fatigue, fever, night sweats, weight gain or weight loss Psychological ROS: negative for - behavioral disorder, hallucinations, memory difficulties, mood swings or suicidal ideation Ophthalmic ROS: negative for - blurry vision, double vision, eye pain or loss of vision ENT ROS: negative for - epistaxis, nasal discharge, oral lesions, sore throat, tinnitus or vertigo Allergy and Immunology ROS: negative for - hives or itchy/watery eyes Hematological and Lymphatic ROS: negative for - bleeding problems, bruising or swollen lymph nodes Endocrine ROS: negative for - galactorrhea, hair pattern changes, polydipsia/polyuria or temperature intolerance Respiratory ROS: as noted in HPI Cardiovascular ROS: negative for - chest pain, dyspnea on exertion, edema or irregular heartbeat Gastrointestinal ROS: abdominal pain Genito-Urinary ROS: negative for - dysuria, hematuria, incontinence or urinary frequency/urgency Musculoskeletal ROS: negative for - joint swelling or muscular weakness Neurological ROS: as noted in HPI Dermatological ROS: negative for rash and skin lesion changes   Physical Examination: Blood pressure ROLLEN)  185/86, pulse 93, temperature 98.6 F (37 C), temperature source Oral, resp. rate (!) 22, height 5' 5 (1.651 m), weight 86.2 kg, SpO2 98%.  NIHSS components Score: Comment  1a Level of Conscious 0[x]  1[]  2[]  3[]      1b LOC Questions 0[x]  1[]  2[]       1c LOC Commands 0[x]  1[]  2[]       2 Best  Gaze 0[x]  1[]  2[]       3 Visual 0[x]  1[]  2[]  3[]      4 Facial Palsy 0[]  1[x]  2[]  3[]      5a Motor Arm - left 0[x]  1[]  2[]  3[]  4[]  UN[]    5b Motor Arm - Right 0[x]  1[]  2[]  3[]  4[]  UN[]    6a Motor Leg - Left 0[x]  1[]  2[]  3[]  4[]  UN[]    6b Motor Leg - Right 0[x]  1[]  2[]  3[]  4[]  UN[]    7 Limb Ataxia 0[x]  1[]  2[]  3[]  UN[]     8 Sensory 0[x]  1[]  2[]  UN[]      9 Best Language 0[x]  1[]  2[]  3[]      10 Dysarthria 0[x]  1[]  2[]  UN[]      11 Extinct. and Inattention 0[x]  1[]  2[]       TOTAL:       Laboratory Studies:   Basic Metabolic Panel: No results for input(s): NA, K, CL, CO2, GLUCOSE, BUN, CREATININE, CALCIUM , MG, PHOS in the last 168 hours.  Liver Function Tests: No results for input(s): AST, ALT, ALKPHOS, BILITOT, PROT, ALBUMIN in the last 168 hours. No results for input(s): LIPASE, AMYLASE in the last 168 hours. No results for input(s): AMMONIA in the last 168 hours.  CBC: Recent Labs  Lab 03/14/24 1429  WBC 10.9*  NEUTROABS 8.4*  HGB 14.7  HCT 43.8  MCV 88.7  PLT 235    Cardiac Enzymes: No results for input(s): CKTOTAL, CKMB, CKMBINDEX, TROPONINI in the last 168 hours.  BNP: Invalid input(s): POCBNP  CBG: Recent Labs  Lab 03/14/24 1411  GLUCAP 202*    Microbiology: Results for orders placed or performed during the hospital encounter of 11/07/18  SARS Coronavirus 2 [facility] order, Performed in [facility] hospital lab) Nasopharyngeal Nasopharyngeal Swab     Status: None   Collection Time: 11/07/18  1:14 AM   Specimen: Nasopharyngeal Swab  Result Value Ref Range Status   SARS Coronavirus 2 NEGATIVE NEGATIVE Final    Comment: (NOTE) If result is NEGATIVE SARS-CoV-2 target nucleic acids are NOT DETECTED. The SARS-CoV-2 RNA is generally detectable in upper and lower  respiratory specimens during the acute phase of infection. The lowest  concentration of SARS-CoV-2 viral copies this assay can detect is 250  copies / mL.  A negative result does not preclude SARS-CoV-2 infection  and should not be used as the sole basis for treatment or other  patient management decisions.  A negative result may occur with  improper specimen collection / handling, submission of specimen other  than nasopharyngeal swab, presence of viral mutation(s) within the  areas targeted by this assay, and inadequate number of viral copies  (<250 copies / mL). A negative result must be combined with clinical  observations, patient history, and epidemiological information. If result is POSITIVE SARS-CoV-2 target nucleic acids are DETECTED. The SARS-CoV-2 RNA is generally detectable in upper and lower  respiratory specimens dur ing the acute phase of infection.  Positive  results are indicative of active infection with SARS-CoV-2.  Clinical  correlation with patient history and other diagnostic information is  necessary to determine patient infection status.  Positive results do  not rule out bacterial infection or co-infection with other viruses. If result is PRESUMPTIVE POSTIVE SARS-CoV-2 nucleic acids MAY BE PRESENT.   A presumptive positive result was obtained on the submitted specimen  and confirmed on repeat testing.  While 2019 novel coronavirus  (SARS-CoV-2) nucleic acids may be present in the submitted sample  additional confirmatory testing may be necessary for epidemiological  and / or clinical management purposes  to differentiate between  SARS-CoV-2 and other Sarbecovirus currently known to infect humans.  If clinically indicated additional testing with an alternate test  methodology [phone]) is advised. The SARS-CoV-2 RNA is generally  detectable in upper and lower respiratory sp ecimens during the acute  phase of infection. The expected result is Negative. Fact Sheet for Patients:  boilerbrush.com.cy Fact Sheet for Healthcare Providers: https://pope.com/ This test is not  yet approved or cleared by the [place]  FDA and has been authorized for detection and/or diagnosis of SARS-CoV-2 by FDA under an Emergency Use Authorization (EUA).  This EUA will remain in effect (meaning this test can be used) for the duration of the COVID-19 declaration under Section 564(b)(1) of the Act, 21 U.S.C. section 360bbb-3(b)(1), unless the authorization is terminated or revoked sooner. Performed at [facility], [address] [place], [place] [address]   Culture, blood (routine x 2)     Status: None   Collection Time: 11/08/18  2:33 PM   Specimen: BLOOD  Result Value Ref Range Status   Specimen Description BLOOD RIGHT ANTECUBITAL  Final   Special Requests   Final    Blood Culture results may not be optimal due to an inadequate volume of blood received in culture bottles   Culture   Final    NO GROWTH 5 DAYS Performed at [facility], [address], [place], [place] [address]    Report Status 11/13/2018 FINAL  Final  Culture, blood (routine x 2)     Status: None   Collection Time: 11/08/18  3:39 PM   Specimen: BLOOD  Result Value Ref Range Status   Specimen Description BLOOD BLOOD RIGHT FOREARM  Final   Special Requests Blood Culture adequate volume  Final   Culture   Final    NO GROWTH 5 DAYS Performed at [facility], [address], [place], [place] [address]    Report Status 11/13/2018 FINAL  Final    Coagulation Studies: No results for input(s): LABPROT, INR in the last 72 hours.  Urinalysis: No results for input(s): COLORURINE, LABSPEC, PHURINE, GLUCOSEU, HGBUR, BILIRUBINUR, KETONESUR, PROTEINUR, UROBILINOGEN, NITRITE, LEUKOCYTESUR in the last 168 hours.  Invalid input(s): APPERANCEUR  Lipid Panel:     Component Value Date/Time   CHOL 190 02/06/2024 0157   TRIG 173 (H) 02/06/2024 0157   HDL 31 (L) 02/06/2024 0157   CHOLHDL 6.1 02/06/2024 0157   VLDL 35 02/06/2024 0157   LDLCALC 124 (H)  02/06/2024 0157    HgbA1C:  Lab Results  Component Value Date   HGBA1C 8.8 (H) 02/05/2024    Urine Drug Screen:  No results found for: LABOPIA, COCAINSCRNUR, LABBENZ, AMPHETMU, THCU, LABBARB  Alcohol Level: No results for input(s): ETH in the last 168 hours.   Imaging: CT HEAD CODE STROKE WO CONTRAST Result Date: 03/14/2024 EXAM: CT HEAD WITHOUT CONTRAST 03/14/2024 02:21:37 PM TECHNIQUE: CT of the head was performed without the administration of intravenous contrast. Automated exposure control, iterative reconstruction, and/or weight based adjustment of the mA/kV was utilized to reduce the radiation  dose to as low as reasonably achievable. COMPARISON: 02/05/2024. CLINICAL HISTORY: Neuro deficit, acute, stroke suspected. Acute neurologic deficit; stroke suspected. FINDINGS: BRAIN AND VENTRICLES: No acute hemorrhage. No evidence of acute infarct. No hydrocephalus. No extra-axial collection. No mass effect or midline shift. Similar encephalomalacia in the anterior right frontal lobe which may be related to prior trauma versus remote infarct. Atherosclerosis of the carotid siphons. [place] stroke program early CT (ASPECT) score: Ganglionic (caudate, IC, lentiform nucleus, insula, M1-M3): 7. Supraganglionic (M4-M6): 3. Total: 10. ORBITS: Bilateral lens replacement. SINUSES: Mucosal thickening in the partially visualized right maxillary sinus. SOFT TISSUES AND SKULL: No acute soft tissue abnormality. No skull fracture. IMPRESSION: 1. No acute intracranial abnormality. 2. ASPECTS is 10. 3. Encephalomalacia in the anterior right frontal lobe, possibly related to prior trauma or remote infarct. 4. Findings messaged to Dr. Germaine via the [facility] messaging system at 2:27 PM on 03/14/24. Electronically signed by: Donnice Mania MD 03/14/2024 02:28 PM EST RP Workstation: [id number]     Assessment/Plan: 58 y.o. male with a history of DM who went to the clinic today because he was having SOB at home.   Has had episodes of left sided numbness for the past 2 weeks.  Episodes area self limited.  Head CT personally reviewed and shows no acute changes. BP elevated and episodic paresthesias may be related to same.    Recommendations: Risk factor modification.  Last A1c elevated.  BP control with target BP<140/80 ASA 325mg  today with 81mg  po daily to start on 03/15/24   Case discussed with Dr. Nicholaus Sonny Germaine, MD Neurology  03/14/2024, 2:59 PM          [1]  Allergies Allergen Reactions   Metformin Shortness Of Breath    Reports liver damage Reports liver damage Reports liver damage

## 2024-03-14 NOTE — ED Provider Notes (Signed)
 Patient was signed out to me awaiting CT imaging results.  Initial concern was possible stroke but seems evaluated by stroke neurology who suggested no acute stroke with plan for daily 81 mg aspirin  and routine outpatient follow-up.  However there was concern of persistent abdominal pain so at the time of signout was awaiting CT imaging results with anticipated discharge home.  CT imaging is reassuring without acute findings.  Given his history and exam suspect likely peptic ulcer disease which may be resulting in his symptoms.  I will write for a brief course of Protonix  for him for home.  Will also recommend gastroenterology follow-up.  CT of the abdomen pelvis also demonstrating concern of possible bladder or prostate tumor, will recommend follow-up with urology will provide him with phone number for contact information.  Interpreter Tyro [id number] was utilized.   Fernand Rossie HERO, MD 03/14/24 [phone]

## 2024-03-14 NOTE — ED Triage Notes (Addendum)
 Pt comes with sob and left arm numbness. Pt states  sob started today at 1230. Pt states no cp. Pt states left arm numbness started 1230 also. Pt had recent stroke in December.   Pt states no blurry vision or slurred speech. Pt states he I still having arm numbness. Pt on daily aspirin .

## 2024-03-14 NOTE — ED Notes (Signed)
 Per neuro cancel code stroke  no further NIH required

## 2024-03-14 NOTE — ED Notes (Signed)
 CCMD aware

## 2024-03-14 NOTE — ED Notes (Signed)
 CT called to get room for pt, pt taken to CT 2

## 2024-03-14 NOTE — ED Notes (Signed)
 [facility] called for codestroke per Dr. Nicholaus Md. , spoke with [facility]

## 2024-03-14 NOTE — ED Provider Notes (Signed)
 "  [facility] Provider Note    Event Date/Time   First MD Initiated Contact with Patient 03/14/24 1417     (approximate)   History   Shortness of Breath  Patient is primarily Spanish-speaking only and history is obtained using the iPad Spanish interpreter  HPI  Derek George is a 59 y.o. male   type 2 diabetes, hypertension with recent anterior pons stroke in December who presents today with multiple issues including intermittent left upper extremity numbness for several weeks, last that started at 1230 (2 hours prior to arrival), 24 hours of left sided abdominal pain and elevated blood pressure.  Patient states that his last bowel movement was this morning and was normal.  He has worse abdominal pain especially after eating on the left side.  Denies any changes in his penis or scrotum or difficulty urinating.  He denies any headache hearing or vision changes or sensation changes in her other extremities.  He has no new weakness.  He reports compliance with all of his medications.  His daughter presents with him and contributes to the history.  Patient does have a prior history of appendectomy and cholecystectomy.      Physical Exam   Triage Vital Signs: ED Triage Vitals [03/14/24 1410]  Encounter Vitals Group     BP (!) 168/88     Girls Systolic BP Percentile      Girls Diastolic BP Percentile      Boys Systolic BP Percentile      Boys Diastolic BP Percentile      Pulse Rate 91     Resp 19     Temp 98 F (36.7 C)     Temp src      SpO2 98 %     Weight 190 lb (86.2 kg)     Height 5' 5 (1.651 m)     Head Circumference      Peak Flow      Pain Score 0     Pain Loc      Pain Education      Exclude from Growth Chart     Most recent vital signs: Vitals:   03/14/24 1441 03/14/24 1500  BP:  (!) 159/81  Pulse:  88  Resp:  20  Temp: 98.6 F (37 C)   SpO2:  98%    Nursing Triage Note reviewed. Vital signs reviewed and patients oxygen  saturation is normoxic  General: Patient is well nourished, well developed, awake and alert, resting comfortably in no acute distress Head: Normocephalic and atraumatic Eyes: Normal inspection, extraocular muscles intact, no conjunctival pallor Ear, nose, throat: Normal external exam Neck: Normal range of motion Respiratory: Patient is in no respiratory distress, lungs CTAB Cardiovascular: Patient is not tachycardic, RRR without murmur appreciated GI: Abd soft, ttp in LUQ and LLQ no guarding or rebound, no CVA ttp Back: Normal inspection of the back with good strength and range of motion throughout all ext Extremities: pulses intact with good cap refills, no LE pitting edema or calf tenderness Neuro: The patient is alert and oriented to person, place, and time, appropriately conversive, with 5/5 bilat UE/LE strength, no gross defects noted, possible resolving her left upper extremity subjective numbness. Coordination appears to be adequate. Skin: Warm, dry, and intact Psych: anxious mood and affect, no SI or HI  ED Results / Procedures / Treatments   Labs (all labs ordered are listed, but only abnormal results are displayed) Labs Reviewed  CBC - Abnormal;  Notable for the following components:      Result Value   WBC 10.9 (*)    All other components within normal limits  DIFFERENTIAL - Abnormal; Notable for the following components:   Neutro Abs 8.4 (*)    All other components within normal limits  COMPREHENSIVE METABOLIC PANEL WITH GFR - Abnormal; Notable for the following components:   Glucose, Bld 207 (*)    All other components within normal limits  CBG MONITORING, ED - Abnormal; Notable for the following components:   Glucose-Capillary 202 (*)    All other components within normal limits  PROTIME-INR  APTT  ETHANOL  LIPASE, BLOOD  URINALYSIS, ROUTINE W REFLEX MICROSCOPIC  I-STAT CREATININE, ED     EKG EKG and rhythm strip are interpreted by myself:   EKG: [Normal  sinus rhythm] at heart rate of 93, normal QRS duration, QTc 431, nonspecific ST segments and T waves no ectopy EKG not consistent with Acute STEMI Rhythm strip: NSR in lead II   RADIOLOGY CT head: No intracranial hemorrhage per my independent review interpretation radiologist agrees    PROCEDURES:  Critical Care performed: No  Procedures   MEDICATIONS ORDERED IN ED: Medications  famotidine  (PEPCID ) IVPB 20 mg premix (has no administration in time range)  sucralfate  (CARAFATE ) tablet 1 g (has no administration in time range)  sodium chloride  flush (NS) 0.9 % injection 3 mL (3 mLs Intravenous Given 03/14/24 1440)     IMPRESSION / MDM / ASSESSMENT AND PLAN / ED COURSE                                Differential diagnosis includes, but is not limited to, intracranial hemorrhage, colitis, diverticulitis, small bowel obstruction, anemia, electrolyte derangement, UTI, kidney stone, CVA,  ED course: Patient presents acutely from triage and I meet the patient in the scanner for the first time.  Stroke neurologist Dr. Germaine at patient's bedside.  After CT head Noncon which was negative, patient thoroughly examined and code stroke canceled by stroke neurologist.  She does not think symptoms are consistent with any acute CVA but does recommend that if the rest of the patient's workup today is unremarkable that he received 325 mg of aspirin  prior to discharge and be initiated on 81 mg of aspirin  daily thereafterwards.  Patient brought back to the room and reexamined with the Spanish interpreter.  His main issue is abdominal pain and his elevated blood pressure he states.  Luckily on his EKG I do not see any evidence of heart strain and his blood pressure is downtrending without any medication.  He is tender to his left side he does have a mild leukocytosis.  He does have a slightly elevated glucose without any anion gap.  He has no coagulation abnormalities.  I have ordered a CT abdomen pelvis  which is pending along with a urinalysis and the remainder of his labs.  His case was signed out to oncoming physician at 3 PM   Clinical Course as of 03/14/24 1524  Thu Mar 14, 2024  1447 Stroke alert canceled by neurology team.  She does not think this is secondary to a stroke but if patient's workup is unremarkable she would give a full dose (325 mg) of aspirin  here in our emergency department and have the patient be sent out with 81 mg of aspirin  daily afterwards [HD]    Clinical Course User Index [HD] Nicholaus Rolland BRAVO,  MD   -- Risk: 5 This patient has a high risk of morbidity due to further diagnostic testing or treatment. Rationale: This patients evaluation and management involve a high risk of morbidity due to the potential severity of presenting symptoms, need for diagnostic testing, and/or initiation of treatment that may require close monitoring. The differential includes conditions with potential for significant deterioration or requiring escalation of care. Treatment decisions in the ED, including medication administration, procedural interventions, or disposition planning, reflect this level of risk. COPA: 5 The patient has the following acute or chronic illness/injury that poses a possible threat to life or bodily function: [X] : The patient has a potentially serious acute condition or an acute exacerbation of a chronic illness requiring urgent evaluation and management in the Emergency Department. The clinical presentation necessitates immediate consideration of life-threatening or function-threatening diagnoses, even if they are ultimately ruled out.   FINAL CLINICAL IMPRESSION(S) / ED DIAGNOSES   Final diagnoses:  Left sided abdominal pain  Secondary hypertension  Paresthesia     Rx / DC Orders   ED Discharge Orders     None        Note:  This document was prepared using Dragon voice recognition software and may include unintentional dictation errors.   Nicholaus Rolland BRAVO, MD 03/14/24 1524  "

## 2024-03-14 NOTE — ED Notes (Signed)
 Secretary Jon called and given info to call code stroke

## 2024-03-14 NOTE — Discharge Instructions (Signed)
 You were seen today due to concern of numbness tingling and abdominal pain.  At this time your labs and imaging are fortunately reassuring, however you have been seen by neurologist and they are recommending taking 1 tablet of 81 mg aspirin  daily.  I am also writing for a course of Protonix  for you to take, please take this about 1 hour prior to meals.  On top of that on your CT scan that we did notice a unusual irregularity along your bladder and prostate, you need to follow-up with the urologist to have this further worked up I provided you with their phone number, please call them to arrange for a follow-up appointment.  Hoy le atendimos debido a entumecimiento, hormigueo y engineer, mining abdominal. Afortunadamente, los [place] de sus [place] y estudios de imagen son tranquilizadores. Sin embargo, un neurlogo le ha examinado y recomienda tomar una tableta de aspirina de 81 mg al c.h. robinson worldwide. Tambin le receto un tratamiento con Protonix ; por favor, tmelo aproximadamente una hora antes de las comidas. Adems, en la tomografa computarizada que le realizamos, observamos una irregularidad inusual en la vejiga y la prstata. Es necesario que consulte con un urlogo para que le realicen ms metlife. Le proporcion su nmero de telfono; por favor, llmelos para programar una cita de seguimiento.

## 2024-03-14 NOTE — Progress Notes (Signed)
 SPIRITUAL CARE AND COUNSELING CONSULT NOTE   VISIT SUMMARY- Patient was unavailable and no family present. Chaplain asked secretary to please call once person is back from CT scan  SPIRITUAL ENCOUNTER                                                                                                                                                                      Type of Visit: Initial Care provided to:: Pt not available Conversation partners present during encounter: Other (comment) Referral source: Code page Reason for visit: Code OnCall Visit: Yes   SPIRITUAL FRAMEWORK      GOALS       INTERVENTIONS   Spiritual Care Interventions Made: Compassionate presence    INTERVENTION OUTCOMES   Outcomes: Connection to spiritual care  SPIRITUAL CARE PLAN        If immediate needs arise, please contact [facility] 24 hour on call [phone]   Derek George  03/14/2024 2:52 PM

## 2024-03-29 ENCOUNTER — Ambulatory Visit: Payer: Self-pay | Admitting: Urology
